# Patient Record
Sex: Female | Born: 1975
Health system: Southern US, Community
[De-identification: ages and names within clinical notes are randomized; demographics above are authoritative.]

## PROBLEM LIST (undated history)

## (undated) DIAGNOSIS — I341 Nonrheumatic mitral (valve) prolapse: Secondary | ICD-10-CM

## (undated) HISTORY — PX: WISDOM TOOTH EXTRACTION: SHX21

## (undated) HISTORY — DX: Nonrheumatic mitral (valve) prolapse: I34.1

---

## 2003-11-16 DIAGNOSIS — I341 Nonrheumatic mitral (valve) prolapse: Secondary | ICD-10-CM

## 2003-11-16 HISTORY — DX: Nonrheumatic mitral (valve) prolapse: I34.1

## 2010-11-26 ENCOUNTER — Inpatient Hospital Stay (HOSPITAL_COMMUNITY)
Admission: AD | Admit: 2010-11-26 | Discharge: 2010-11-28 | Payer: Self-pay | Source: Home / Self Care | Attending: Obstetrics and Gynecology | Admitting: Obstetrics and Gynecology

## 2010-11-30 LAB — CBC
HCT: 36 % (ref 36.0–46.0)
HCT: 30.9 % — ABNORMAL LOW (ref 36.0–46.0)
Hemoglobin: 11.9 g/dL — ABNORMAL LOW (ref 12.0–15.0)
Hemoglobin: 10.4 g/dL — ABNORMAL LOW (ref 12.0–15.0)
MCH: 28.6 pg (ref 26.0–34.0)
MCH: 29.3 pg (ref 26.0–34.0)
MCHC: 33.1 g/dL (ref 30.0–36.0)
MCHC: 33.7 g/dL (ref 30.0–36.0)
MCV: 86.5 fL (ref 78.0–100.0)
MCV: 87 fL (ref 78.0–100.0)
Platelets: 245 10*3/uL (ref 150–400)
Platelets: 212 10*3/uL (ref 150–400)
RBC: 4.16 MIL/uL (ref 3.87–5.11)
RBC: 3.55 MIL/uL — ABNORMAL LOW (ref 3.87–5.11)
RDW: 14 % (ref 11.5–15.5)
RDW: 14.3 % (ref 11.5–15.5)
WBC: 11.3 10*3/uL — ABNORMAL HIGH (ref 4.0–10.5)
WBC: 11.7 10*3/uL — ABNORMAL HIGH (ref 4.0–10.5)

## 2010-11-30 LAB — RPR: RPR Ser Ql: NONREACTIVE

## 2010-11-30 LAB — ABO/RH: ABO/RH(D): O POS

## 2012-04-21 ENCOUNTER — Ambulatory Visit (INDEPENDENT_AMBULATORY_CARE_PROVIDER_SITE_OTHER): Payer: 59 | Admitting: Family Medicine

## 2012-04-21 ENCOUNTER — Encounter: Payer: Self-pay | Admitting: *Deleted

## 2012-04-21 ENCOUNTER — Encounter: Payer: Self-pay | Admitting: Family Medicine

## 2012-04-21 VITALS — BP 110/77 | HR 76 | Temp 98.6°F | Ht 64.0 in | Wt 124.0 lb

## 2012-04-21 DIAGNOSIS — R29818 Other symptoms and signs involving the nervous system: Secondary | ICD-10-CM

## 2012-04-21 DIAGNOSIS — R2689 Other abnormalities of gait and mobility: Secondary | ICD-10-CM

## 2012-04-21 LAB — CBC WITH DIFFERENTIAL/PLATELET
Basophils Absolute: 0.1 10*3/uL (ref 0.0–0.1)
Basophils Relative: 1.2 % (ref 0.0–3.0)
Eosinophils Absolute: 0.1 10*3/uL (ref 0.0–0.7)
Eosinophils Relative: 1.9 % (ref 0.0–5.0)
HCT: 37.8 % (ref 36.0–46.0)
Hemoglobin: 12.8 g/dL (ref 12.0–15.0)
Lymphocytes Relative: 24.5 % (ref 12.0–46.0)
Lymphs Abs: 1.2 10*3/uL (ref 0.7–4.0)
MCHC: 33.8 g/dL (ref 30.0–36.0)
MCV: 90.4 fl (ref 78.0–100.0)
Monocytes Absolute: 0.4 10*3/uL (ref 0.1–1.0)
Monocytes Relative: 8.6 % (ref 3.0–12.0)
Neutro Abs: 3.2 10*3/uL (ref 1.4–7.7)
Neutrophils Relative %: 63.8 % (ref 43.0–77.0)
Platelets: 260 10*3/uL (ref 150.0–400.0)
RBC: 4.19 Mil/uL (ref 3.87–5.11)
RDW: 12.9 % (ref 11.5–14.6)
WBC: 4.9 10*3/uL (ref 4.5–10.5)

## 2012-04-21 LAB — BASIC METABOLIC PANEL
BUN: 11 mg/dL (ref 6–23)
CO2: 28 mEq/L (ref 19–32)
Calcium: 9.3 mg/dL (ref 8.4–10.5)
Chloride: 108 mEq/L (ref 96–112)
Creatinine, Ser: 0.7 mg/dL (ref 0.4–1.2)
GFR: 103.41 mL/min (ref >60.00)
Glucose, Bld: 91 mg/dL (ref 70–99)
Potassium: 3.7 mEq/L (ref 3.5–5.1)
Sodium: 140 mEq/L (ref 135–145)

## 2012-04-21 LAB — VITAMIN B12: Vitamin B-12: 308 pg/mL (ref 211–911)

## 2012-04-21 LAB — TSH: TSH: 0.73 u[IU]/mL (ref 0.35–5.50)

## 2012-04-21 LAB — FOLATE: Folate: 19.6 ng/mL (ref >5.9)

## 2012-04-21 MED ORDER — MECLIZINE HCL 25 MG PO TABS: 25.0000 mg | ORAL_TABLET | Freq: Three times a day (TID) | ORAL | Status: AC | PRN

## 2012-04-21 NOTE — Patient Instructions (Signed)
We'll notify you of your lab results Someone will call you with your neurology appt Take the Meclizine and see if symptoms improve- take when not working first Call with any questions or concerns Hang in there!!

## 2012-04-21 NOTE — Progress Notes (Signed)
  Subjective:    Patient ID: Julie Gibson, female    DOB: 01/25/1976, 36 y.o.   MRN: [id number]  HPI New to establish.  [facility]- [employer] [facility]  [facility]- [facility]  Dizziness- sxs started 3 weeks ago, 'like being on that carnival ride where the floor drops out from underneath you'.  'my balance is a little off'.  Becoming more frequent.  Has not fallen or passed out.  Occuring 6-7x/day.  Hx of sporadic eating but sxs are not related to eating.  Denies ear or sinus pain.  Not reproducible.  Doesn't occur w/ position changes.  No nausea.  'will have an odd feeling before'.  Occurs more frequently at work.  Denies any changes in last 3 weeks- meds, stress, sleep habits.  'i don't feel sick- just not right'.  Has difficulty walking down long hallway due to tunnel like effect.  No trouble w/ stairs.  No visual changes.  When she has the sense of falling it's 'always to the R'   Review of Systems For ROS see HPI     Objective:   Physical Exam  Vitals reviewed. Constitutional: She is oriented to person, place, and time. She appears well-developed and well-nourished. No distress.  HENT:  Head: Normocephalic and atraumatic.  Right Ear: Hearing, tympanic membrane and ear canal normal.  Left Ear: Hearing, tympanic membrane and ear canal normal.  Nose: Right sinus exhibits no maxillary sinus tenderness and no frontal sinus tenderness. Left sinus exhibits no maxillary sinus tenderness and no frontal sinus tenderness.  Mouth/Throat: Uvula is midline. No oropharyngeal exudate, posterior oropharyngeal edema or posterior oropharyngeal erythema.  Eyes: Conjunctivae and EOM are normal. Pupils are equal, round, and reactive to light.  Neck: Normal range of motion. Neck supple. No thyromegaly present.  Cardiovascular: Normal rate, regular rhythm, normal heart sounds and intact distal pulses.   No murmur heard. Pulmonary/Chest: Effort normal and breath sounds normal. No respiratory distress.  Abdominal: Soft. She  exhibits no distension. There is no tenderness.  Musculoskeletal: She exhibits no edema.  Lymphadenopathy:    She has no cervical adenopathy.  Neurological: She is alert and oriented to person, place, and time. She has normal reflexes. She displays no tremor. No cranial nerve deficit or sensory deficit. She exhibits normal muscle tone. Coordination (positive Romberg) abnormal. Gait normal.       2 beats of horizontal nystagmus to the L  Skin: Skin is warm and dry.  Psychiatric: She has a normal mood and affect. Her behavior is normal.          Assessment & Plan:

## 2012-04-21 NOTE — Assessment & Plan Note (Signed)
New.  Pt w/out typical sxs of vertigo.  No evidence of ear or sinus infxn.  Only abnormality on PE is nystagmus and + Romberg.  Check labs to r/o electrolyte abnormality, b12/folate deficiency, thyroid abnormality.  Discussed imaging w/ pt- she prefers to hold off until seeing Neuro.  Pt to call if sxs change or worsen.  Reviewed supportive care and red flags that should prompt return.  Pt expressed understanding and is in agreement w/ plan.

## 2017-04-02 DIAGNOSIS — S30860A Insect bite (nonvenomous) of lower back and pelvis, initial encounter: Secondary | ICD-10-CM | POA: Diagnosis not present

## 2020-08-15 ENCOUNTER — Other Ambulatory Visit: Payer: Self-pay

## 2020-08-15 ENCOUNTER — Ambulatory Visit (INDEPENDENT_AMBULATORY_CARE_PROVIDER_SITE_OTHER): Payer: 59 | Admitting: Physician Assistant

## 2020-08-15 ENCOUNTER — Telehealth: Payer: Self-pay | Admitting: Physician Assistant

## 2020-08-15 ENCOUNTER — Encounter: Payer: Self-pay | Admitting: Physician Assistant

## 2020-08-15 VITALS — BP 122/70 | HR 91 | Temp 97.7°F | Ht 63.0 in | Wt 140.4 lb

## 2020-08-15 DIAGNOSIS — G5621 Lesion of ulnar nerve, right upper limb: Secondary | ICD-10-CM | POA: Diagnosis not present

## 2020-08-15 MED ORDER — MELOXICAM 15 MG PO TABS: 15.0000 mg | ORAL_TABLET | Freq: Every day | ORAL | 0 refills | Status: DC

## 2020-08-15 NOTE — Progress Notes (Signed)
Patient presents to clinic today to establish care.  Patient endorses ongoing issue of pain, numbness/tingling of her right arm, now with decreased grip strength.  Notes symptoms started out mainly involving her fourth and fifth fingers but is now spread to include the bulk of her hand.  Denies any wrist pain or swelling.  Does note medial elbow pain, worse with certain activities.  Denies any prior trauma or injury.  Has been trying to keep her arm outstretched even while sleeping but denies any significant improvement in her symptoms with this.  Denies symptoms of her left upper extremity.  Health Maintenance: Immunizations -- Gets flu shot at work.  PAP -- UTD per patient.   Past Medical History:  Diagnosis Date  . MVP (mitral valve prolapse) 2005   diagnosed after bout of tachycardia. Has not had issue in over 10 years    Past Surgical History:  Procedure Laterality Date  . WISDOM TOOTH EXTRACTION      Current Outpatient Medications on File Prior to Visit  Medication Sig Dispense Refill  . ibuprofen (ADVIL) 400 MG tablet Take 400 mg by mouth as needed.    Marland Kitchen levonorgestrel (MIRENA) 20 MCG/24HR IUD 1 each by Intrauterine route once.     No current facility-administered medications on file prior to visit.    Allergies  Allergen Reactions  . Percocet [Oxycodone-Acetaminophen]     Nausea, vomitting    Family History  Problem Relation Age of Onset  . Hypertension Mother   . Hypertension Father     Social History   Socioeconomic History  . Marital status: Married    Spouse name: Not on file  . Number of children: Not on file  . Years of education: Not on file  . Highest education level: Not on file  Occupational History  . Not on file  Tobacco Use  . Smoking status: Never Smoker  . Smokeless tobacco: Never Used  Vaping Use  . Vaping Use: Never used  Substance and Sexual Activity  . Alcohol use: Yes    Alcohol/week: 1.0 standard drink    Types: 1 Glasses of  wine per week    Comment: Every month   . Drug use: No  . Sexual activity: Yes    Birth control/protection: I.U.D.    Comment: Mirena  Other Topics Concern  . Not on file  Social History Narrative  . Not on file   Social Determinants of Health   Financial Resource Strain:   . Difficulty of Paying Living Expenses: Not on file  Food Insecurity:   . Worried About Programme researcher, broadcasting/film/video in the Last Year: Not on file  . Ran Out of Food in the Last Year: Not on file  Transportation Needs:   . Lack of Transportation (Medical): Not on file  . Lack of Transportation (Non-Medical): Not on file  Physical Activity:   . Days of Exercise per Week: Not on file  . Minutes of Exercise per Session: Not on file  Stress:   . Feeling of Stress : Not on file  Social Connections:   . Frequency of Communication with Friends and Family: Not on file  . Frequency of Social Gatherings with Friends and Family: Not on file  . Attends Religious Services: Not on file  . Active Member of Clubs or Organizations: Not on file  . Attends Banker Meetings: Not on file  . Marital Status: Not on file  Intimate Partner Violence:   . Fear of  Current or Ex-Partner: Not on file  . Emotionally Abused: Not on file  . Physically Abused: Not on file  . Sexually Abused: Not on file   ROS Pertinent ROS are listed in the HPI.  BP 122/70   Pulse 91   Temp 97.7 F (36.5 C) (Temporal)   Ht 5\' 3"  (1.6 m)   Wt 140 lb 6.4 oz (63.7 kg)   SpO2 99%   BMI 24.87 kg/m   Physical Exam Vitals reviewed.  Constitutional:      Appearance: Normal appearance.  HENT:     Head: Normocephalic and atraumatic.  Cardiovascular:     Rate and Rhythm: Normal rate and regular rhythm.     Pulses: Normal pulses.     Heart sounds: Normal heart sounds.  Pulmonary:     Effort: Pulmonary effort is normal.  Musculoskeletal:     Right elbow: Normal.     Right forearm: Normal.     Right wrist: No swelling, deformity, effusion,  tenderness, bony tenderness or crepitus. Normal range of motion.     Right hand: No swelling or deformity. Normal range of motion. Decreased strength of thumb/finger opposition. Normal strength of finger abduction and wrist extension. Decreased sensation of the ulnar distribution and median distribution. There is no disruption of two-point discrimination. Normal capillary refill. Normal pulse.     Cervical back: Neck supple.  Neurological:     General: No focal deficit present.     Mental Status: She is alert.     Assessment/Plan: 1. Cubital tunnel syndrome on right Concern for substantial cubital tunnel syndrome and potentially other areas of nerve impingement/compression given the severity and progression of her symptoms.  Decreased grip strength noted.  Decreased sensation in third fourth and fifth fingers of the hand.  We will have her continue supportive measures and bracing.  Urgent referral placed to orthopedic surgery for further evaluation and management. - Ambulatory referral to Orthopedic Surgery  This visit occurred during the SARS-CoV-2 public health emergency.  Safety protocols were in place, including screening questions prior to the visit, additional usage of staff PPE, and extensive cleaning of exam room while observing appropriate contact time as indicated for disinfecting solutions.     , PA-C

## 2020-08-15 NOTE — Telephone Encounter (Signed)
Medication was resent

## 2020-08-15 NOTE — Patient Instructions (Signed)
Please continue working on keeping the arm fully extended and elevated when possible.  Take the Meloxicam once daily with food. Tylenol if needed for breakthrough pain.   I am getting you set up with a specialist. You should receive a phone call within a week to schedule an appointment.   If not, please call me.    Cubital Tunnel Syndrome  Cubital tunnel syndrome is a condition that causes pain and weakness of the forearm and hand. It happens when one of the nerves that runs along the inside of the elbow joint (ulnar nerve) becomes irritated. This condition is usually caused by repeated arm motions that are done during sports or work-related activities. What are the causes? This condition may be caused by:  Increased pressure on the ulnar nerve at the elbow, arm, or forearm. This can result from: ? Irritation caused by repeated elbow bending. ? Poorly healed elbow fractures. ? Tumors in the elbow. These are usually noncancerous (benign). ? Scar tissue that develops in the elbow after an injury. ? Bony growths (spurs) near the ulnar nerve.  Stretching of the nerve due to loose elbow ligaments.  Trauma to the nerve at the elbow. What increases the risk? The following factors may make you more likely to develop this condition:  Doing manual labor that requires frequent bending of the elbow.  Playing sports that include repeated or strenuous throwing motions, such as baseball.  Playing contact sports, such as football or lacrosse.  Not warming up properly before activities.  Having diabetes.  Having an underactive thyroid (hypothyroidism). What are the signs or symptoms? Symptoms of this condition include:  Clumsiness or weakness of the hand.  Tenderness of the inner elbow.  Aching or soreness of the inner elbow, forearm, or fingers, especially the little finger or the ring finger.  Increased pain when forcing the elbow to bend.  Reduced control when throwing  objects.  Tingling, numbness, or a burning feeling inside the forearm or in part of the hand or fingers, especially the little finger or the ring finger.  Sharp pains that shoot from the elbow down to the wrist and hand.  The inability to grip or pinch hard. How is this diagnosed? This condition is diagnosed based on:  Your symptoms and medical history. Your health care provider will also ask for details about any injury.  A physical exam. You may also have tests, including:  Electromyogram (EMG). This test measures electrical signals sent by your nerves into the muscles.  Nerve conduction study. This test measures how well electrical signals pass through your nerves.  Imaging tests, such as X-rays, ultrasound, and MRI. These tests check for possible causes of your condition. How is this treated? This condition may be treated by:  Stopping the activities that are causing your symptoms to get worse.  Icing and taking medicines to reduce pain and swelling.  Wearing a splint to prevent your elbow from bending, or wearing an elbow pad where the ulnar nerve is closest to the skin.  Working with a physical therapist in less severe cases. This may help to: ? Decrease your symptoms. ? Improve the strength and range of motion of your elbow, forearm, and hand. If these treatments do not help, surgery may be needed. Follow these instructions at home: If you have a splint:  Wear the splint as told by your health care provider. Remove it only as told by your health care provider.  Loosen the splint if your fingers tingle, become  numb, or turn cold and blue.  Keep the splint clean.  If the splint is not waterproof: ? Do not let it get wet. ? Cover it with a watertight covering when you take a bath or shower. Managing pain, stiffness, and swelling   If directed, put ice on the injured area: ? Put ice in a plastic bag. ? Place a towel between your skin and the bag. ? Leave the ice  on for 20 minutes, 2-3 times a day.  Move your fingers often to avoid stiffness and to lessen swelling.  Raise (elevate) the injured area above the level of your heart while you are sitting or lying down. General instructions  Take over-the-counter and prescription medicines only as told by your health care provider.  Do any exercise or physical therapy as told by your health care provider.  Do not drive or use heavy machinery while taking prescription pain medicine.  If you were given an elbow pad, wear it as told by your health care provider.  Keep all follow-up visits as told by your health care provider. This is important. Contact a health care provider if:  Your symptoms get worse.  Your symptoms do not get better with treatment.  You have new pain.  Your hand on the injured side feels numb or cold. Summary  Cubital tunnel syndrome is a condition that causes pain and weakness of the forearm and hand.  You are more likely to develop this condition if you do work or play sports that involve repeated arm movements.  This condition is often treated by stopping repetitive activities, applying ice, and using anti-inflammatory medicines.  In rare cases, surgery may be needed. This information is not intended to replace advice given to you by your health care provider. Make sure you discuss any questions you have with your health care provider. Document Revised: 03/20/2018 Document Reviewed: 03/20/2018 Elsevier Patient Education  2020 [employer].

## 2020-08-15 NOTE — Telephone Encounter (Signed)
Pt made aware

## 2020-08-15 NOTE — Telephone Encounter (Signed)
Pt called in stating that her current pharmacy is [facility] [address] [place]   Please re-send scripts to that pharmacy.

## 2020-08-28 ENCOUNTER — Ambulatory Visit: Payer: 59 | Admitting: Orthopaedic Surgery

## 2020-08-28 ENCOUNTER — Encounter: Payer: Self-pay | Admitting: Orthopaedic Surgery

## 2020-08-28 DIAGNOSIS — R202 Paresthesia of skin: Secondary | ICD-10-CM | POA: Diagnosis not present

## 2020-08-28 DIAGNOSIS — R200 Anesthesia of skin: Secondary | ICD-10-CM | POA: Insufficient documentation

## 2020-08-28 NOTE — Progress Notes (Signed)
Office Visit Note   Patient: Julie Gibson           Date of Birth: Aug 09, 1976           MRN: [id number] Visit Date: 08/28/2020              Requested by: Waldon Merl, PA-C [id number] [place],  [place] [address] PCP: Waldon Merl, PA-C   Assessment & Plan: Visit Diagnoses:  1. Numbness and tingling in right hand     Plan: Impression is right cubital tunnel syndrome.  We will obtain nerve conduction studies at this time to evaluate for this.  Follow-up afterwards.  Follow-Up Instructions: Return if symptoms worsen or fail to improve.   Orders:  No orders of the defined types were placed in this encounter.  No orders of the defined types were placed in this encounter.     Procedures: No procedures performed   Clinical Data: No additional findings.   Subjective: Chief Complaint  Patient presents with  . 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 [employer] comes in for evaluation of possible cubital tunnel syndrome.  Referral from PCP.  She has pain in her elbow with flexion and numbness in her ring and small finger with recent worsening over the last 2 weeks.  She has been taking meloxicam which has helped.  She feels like overall her condition is getting worse.  Extending her elbow helps with the symptoms.  Denies any injuries.   Review of Systems  Constitutional: Negative.   HENT: Negative.   Eyes: Negative.   Respiratory: Negative.   Cardiovascular: Negative.   Endocrine: Negative.   Musculoskeletal: Negative.   Neurological: Negative.   Hematological: Negative.   Psychiatric/Behavioral: Negative.   All other systems reviewed and are negative.    Objective: Vital Signs: There were no vitals taken for this visit.  Physical Exam Vitals and nursing note reviewed.  Constitutional:      Appearance: She is well-developed.  HENT:     Head: Normocephalic  and atraumatic.  Pulmonary:     Effort: Pulmonary effort is normal.  Abdominal:     Palpations: Abdomen is soft.  Musculoskeletal:     Cervical back: Neck supple.  Skin:    General: Skin is warm.     Capillary Refill: Capillary refill takes less than 2 seconds.  Neurological:     Mental Status: She is alert and oriented to person, place, and time.  Psychiatric:        Behavior: Behavior normal.        Thought Content: Thought content normal.        Judgment: Judgment normal.     Ortho Exam Right elbow shows stable ulnar nerve.  Positive Tinel's.  Medial epicondyle nontender.  Good strength of the ulnar nerve distribution.  Slight decreased sensation in the small finger. Specialty Comments:  No specialty comments available.  Imaging: No results found.   PMFS History: Patient Active Problem List   Diagnosis Date Noted  . Numbness and tingling in right hand 08/28/2020  . Balance disorder 04/21/2012   Past Medical History:  Diagnosis Date  . MVP (mitral valve prolapse) 2005   diagnosed after bout of tachycardia. Has not had issue in over 10 years    Family History  Problem Relation Age of Onset  . Hypertension Mother   . Hypertension  Father     Past Surgical History:  Procedure Laterality Date  . WISDOM TOOTH EXTRACTION     Social History   Occupational History  . Not on file  Tobacco Use  . Smoking status: Never Smoker  . Smokeless tobacco: Never Used  Vaping Use  . Vaping Use: Never used  Substance and Sexual Activity  . Alcohol use: Yes    Alcohol/week: 1.0 standard drink    Types: 1 Glasses of wine per week    Comment: Every month   . Drug use: No  . Sexual activity: Yes    Birth control/protection: I.U.D.    Comment: Mirena

## 2020-08-29 ENCOUNTER — Other Ambulatory Visit: Payer: Self-pay

## 2020-08-29 DIAGNOSIS — R200 Anesthesia of skin: Secondary | ICD-10-CM

## 2020-08-29 DIAGNOSIS — R202 Paresthesia of skin: Secondary | ICD-10-CM

## 2020-09-30 ENCOUNTER — Other Ambulatory Visit: Payer: Self-pay

## 2020-09-30 ENCOUNTER — Encounter: Payer: Self-pay | Admitting: Physical Medicine and Rehabilitation

## 2020-09-30 ENCOUNTER — Ambulatory Visit: Payer: 59 | Admitting: Physical Medicine and Rehabilitation

## 2020-09-30 DIAGNOSIS — R202 Paresthesia of skin: Secondary | ICD-10-CM | POA: Diagnosis not present

## 2020-09-30 NOTE — Progress Notes (Signed)
Finger numbness right worse than left. Numbness worse in third, fourth, and fifth fingers on right. This has improved. Constant right elbow pain. Right hand dominant No lotion per patient Numeric Pain Rating Scale and Functional Assessment Average Pain 5   In the last MONTH (on 0-10 scale) has pain interfered with the following?  1. General activity like being  able to carry out your everyday physical activities such as walking, climbing stairs, carrying groceries, or moving a chair?  Rating(8)

## 2020-10-03 ENCOUNTER — Other Ambulatory Visit: Payer: Self-pay

## 2020-10-03 ENCOUNTER — Ambulatory Visit: Payer: Self-pay

## 2020-10-03 ENCOUNTER — Ambulatory Visit: Payer: 59 | Admitting: Orthopaedic Surgery

## 2020-10-03 ENCOUNTER — Encounter: Payer: Self-pay | Admitting: Orthopaedic Surgery

## 2020-10-03 DIAGNOSIS — M25541 Pain in joints of right hand: Secondary | ICD-10-CM

## 2020-10-03 DIAGNOSIS — R202 Paresthesia of skin: Secondary | ICD-10-CM | POA: Diagnosis not present

## 2020-10-03 DIAGNOSIS — R200 Anesthesia of skin: Secondary | ICD-10-CM | POA: Diagnosis not present

## 2020-10-03 NOTE — Progress Notes (Signed)
Julie Gibson - 44 y.o. female MRN [id number]  Date of birth: [birth year]  Office Visit Note: Visit Date: 09/30/2020 PCP: Waldon Merl, PA-C Referred by: Waldon Merl, PA-C  Subjective: Chief Complaint  Patient presents with  . Right Hand - Numbness  . Right Elbow - Pain   HPI:  Julie Gibson is a 44 y.o. female who comes in today at the request of Dr. Glee Arvin for electrodiagnostic study of the Right upper extremities.  Patient is Right hand dominant.  She works as a Sales executive.  She reports right sided numbness and tingling particularly in the third fourth and fifth fingers.  Mild symptoms on the left.  She reports constant elbow pain.  Her elbow pain is medial.  No radicular complaints.  No history of cervical spine issues.  Her biggest complaint is her right medial elbow pain.   ROS Otherwise per HPI.  Assessment & Plan: Visit Diagnoses:  1. Paresthesia of skin     Plan: Impression: Essentially NORMAL electrodiagnostic study of the right upper limb.  There is no significant electrodiagnostic evidence of nerve entrapment, brachial plexopathy or cervical radiculopathy.    As you know, purely sensory or demyelinating radiculopathies and chemical radiculitis may not be detected with this particular electrodiagnostic study.  Recommendations: 1.  Follow-up with referring physician. 2.  Continue current management of symptoms.  Meds & Orders: No orders of the defined types were placed in this encounter.   Orders Placed This Encounter  Procedures  . NCV with EMG (electromyography)    Follow-up: Glee Arvin, MD  Procedures: No procedures performed  EMG & NCV Findings: All nerve conduction studies (as indicated in the following tables) were within normal limits.    All examined muscles (as indicated in the following table) showed no evidence of electrical instability.    Impression: Essentially NORMAL electrodiagnostic study of the right upper limb.  There  is no significant electrodiagnostic evidence of nerve entrapment, brachial plexopathy or cervical radiculopathy.    As you know, purely sensory or demyelinating radiculopathies and chemical radiculitis may not be detected with this particular electrodiagnostic study.  Recommendations: 1.  Follow-up with referring physician. 2.  Continue current management of symptoms.  ___________________________ Naaman Plummer FAAPMR Board Certified, American Board of Physical Medicine and Rehabilitation    Nerve Conduction Studies Anti Sensory Summary Table   Stim Site NR Peak (ms) Norm Peak (ms) P-T Amp (V) Norm P-T Amp Site1 Site2 Delta-P (ms) Dist (cm) Vel (m/s) Norm Vel (m/s)  Right Median Acr Palm Anti Sensory (2nd Digit)  31C  Wrist    3.3 <3.6 52.3 >10 Wrist Palm 1.4 0.0    Palm    1.9 <2.0 59.3         Right Radial Anti Sensory (Base 1st Digit)  31.1C  Wrist    2.1 <3.1 38.9  Wrist Base 1st Digit 2.1 0.0    Right Ulnar Anti Sensory (5th Digit)  31.4C  Wrist    3.2 <3.7 42.7 >15.0 Wrist 5th Digit 3.2 14.0 44 >38   Motor Summary Table   Stim Site NR Onset (ms) Norm Onset (ms) O-P Amp (mV) Norm O-P Amp Site1 Site2 Delta-0 (ms) Dist (cm) Vel (m/s) Norm Vel (m/s)  Right Median Motor (Abd Poll Brev)  31.4C  Wrist    3.2 <4.2 7.4 >5 Elbow Wrist 3.6 20.0 56 >50  Elbow    6.8  7.2         Right Ulnar Motor (  Abd Dig Min)  31.7C  Wrist    2.7 <4.2 9.6 >3 B Elbow Wrist 2.9 19.5 67 >53  B Elbow    5.6  9.5  A Elbow B Elbow 1.3 9.5 73 >53  A Elbow    6.9  9.7          EMG   Side Muscle Nerve Root Ins Act Fibs Psw Amp Dur Poly Recrt Int Dennie Bible Comment  Right Abd Poll Brev Median C8-T1 Nml Nml Nml Nml Nml 0 Nml Nml   Right 1stDorInt Ulnar C8-T1 Nml Nml Nml Nml Nml 0 Nml Nml   Right PronatorTeres Median C6-7 Nml Nml Nml Nml Nml 0 Nml Nml   Right Biceps Musculocut C5-6 Nml Nml Nml Nml Nml 0 Nml Nml   Right Deltoid Axillary C5-6 Nml Nml Nml Nml Nml 0 Nml Nml     Nerve Conduction Studies Anti  Sensory Left/Right Comparison   Stim Site L Lat (ms) R Lat (ms) L-R Lat (ms) L Amp (V) R Amp (V) L-R Amp (%) Site1 Site2 L Vel (m/s) R Vel (m/s) L-R Vel (m/s)  Median Acr Palm Anti Sensory (2nd Digit)  31C  Wrist  3.3   52.3  Wrist Palm     Palm  1.9   59.3        Radial Anti Sensory (Base 1st Digit)  31.1C  Wrist  2.1   38.9  Wrist Base 1st Digit     Ulnar Anti Sensory (5th Digit)  31.4C  Wrist  3.2   42.7  Wrist 5th Digit  44    Motor Left/Right Comparison   Stim Site L Lat (ms) R Lat (ms) L-R Lat (ms) L Amp (mV) R Amp (mV) L-R Amp (%) Site1 Site2 L Vel (m/s) R Vel (m/s) L-R Vel (m/s)  Median Motor (Abd Poll Brev)  31.4C  Wrist  3.2   7.4  Elbow Wrist  56   Elbow  6.8   7.2        Ulnar Motor (Abd Dig Min)  31.7C  Wrist  2.7   9.6  B Elbow Wrist  67   B Elbow  5.6   9.5  A Elbow B Elbow  73   A Elbow  6.9   9.7           Waveforms:             Clinical History: No specialty comments available.     Objective:  VS:  HT:    WT:   BMI:     BP:   HR: bpm  TEMP: ( )  RESP:  Physical Exam Musculoskeletal:        General: No swelling, tenderness or deformity.     Comments: Inspection reveals no atrophy of the bilateral APB or FDI or hand intrinsics. There is no swelling, color changes, allodynia or dystrophic changes. There is 5 out of 5 strength in the bilateral wrist extension, finger abduction and long finger flexion. There is intact sensation to light touch in all dermatomal and peripheral nerve distributions.  There is a negative Hoffmann's test bilaterally.  Skin:    General: Skin is warm and dry.     Findings: No erythema or rash.  Neurological:     General: No focal deficit present.     Mental Status: She is alert and oriented to person, place, and time.     Motor: No weakness or abnormal muscle tone.     Coordination: Coordination  normal.  Psychiatric:        Mood and Affect: Mood normal.        Behavior: Behavior normal.      Imaging: No  results found.

## 2020-10-03 NOTE — Progress Notes (Signed)
Office Visit Note   Patient: Julie Gibson           Date of Birth: [birth year]           MRN: [id number] Visit Date: 10/03/2020              Requested by: Waldon Merl, PA-C [address] [place],  [place] [address] PCP: Waldon Merl, PA-C   Assessment & Plan: Visit Diagnoses:  1. Numbness and tingling in right hand     Plan: Impression is right cubital tunnel syndrome with negative nerve conduction studies..  We will refer the patient to Dr. Prince Rome for ultrasound-guided cortisone injection.  We also talked about the possibility of trying gabapentin or Elavil but the patient politely declined.  Follow-Up Instructions: Return in about 4 weeks (around 10/31/2020).   Orders:  Orders Placed This Encounter  Procedures  . XR Elbow 2 Views Right   No orders of the defined types were placed in this encounter.     Procedures: No procedures performed   Clinical Data: No additional findings.   Subjective: Chief Complaint  Patient presents with  . Left Hand - Pain  . 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  She was recently seen in our office with deep aches as well as paresthesias to the right elbow and forearm along the ulnar nerve distribution.  Nerve conduction study ordered to the right upper extremity was negative for cubital tunnel syndrome.  Majority of patient's symptoms occur when her elbow is flexed at 90 degrees and when she tries to grab or grasp something by supinating the elbow.  She notes is doing up the forearm and into the elbow with these motions.  Review of Systems as detailed in HPI.  All others reviewed and are negative.   Objective: Vital Signs: There were no vitals taken for this visit.  Physical Exam well-developed well-nourished female no acute distress.  Alert  and oriented x3.  Ortho Exam Right elbow shows no subluxating ulnar nerve.  She is positive Tinel at the cubital tunnel as well as just proximal to the lateral epicondyle.  Full range of motion of the elbow.  No muscle atrophy of the hand.  Specialty Comments:  No specialty comments available.  Imaging: No results found.   PMFS History: Patient Active Problem List   Diagnosis Date Noted  . Numbness and tingling in right hand 08/28/2020  . Balance disorder 04/21/2012   Past Medical History:  Diagnosis Date  . MVP (mitral valve prolapse) 2005   diagnosed after bout of tachycardia. Has not had issue in over 10 years    Family History  Problem Relation Age of Onset  . Hypertension Mother   . Hypertension Father     Past Surgical History:  Procedure Laterality Date  . WISDOM TOOTH EXTRACTION     Social History   Occupational History  . Not on file  Tobacco Use  . Smoking status: Never Smoker  . Smokeless tobacco: Never Used  Vaping Use  . Vaping Use: Never used  Substance and Sexual Activity  . Alcohol use: Yes    Alcohol/week: 1.0 standard drink    Types: 1 Glasses of wine per week    Comment: Every month   . Drug use: No  .  Sexual activity: Yes    Birth control/protection: I.U.D.    Comment: Mirena

## 2020-10-03 NOTE — Procedures (Signed)
EMG & NCV Findings: All nerve conduction studies (as indicated in the following tables) were within normal limits.    All examined muscles (as indicated in the following table) showed no evidence of electrical instability.    Impression: Essentially NORMAL electrodiagnostic study of the right upper limb.  There is no significant electrodiagnostic evidence of nerve entrapment, brachial plexopathy or cervical radiculopathy.    As you know, purely sensory or demyelinating radiculopathies and chemical radiculitis may not be detected with this particular electrodiagnostic study.  Recommendations: 1.  Follow-up with referring physician. 2.  Continue current management of symptoms.  ___________________________ Naaman Plummer FAAPMR Board Certified, American Board of Physical Medicine and Rehabilitation    Nerve Conduction Studies Anti Sensory Summary Table   Stim Site NR Peak (ms) Norm Peak (ms) P-T Amp (V) Norm P-T Amp Site1 Site2 Delta-P (ms) Dist (cm) Vel (m/s) Norm Vel (m/s)  Right Median Acr Palm Anti Sensory (2nd Digit)  31C  Wrist    3.3 <3.6 52.3 >10 Wrist Palm 1.4 0.0    Palm    1.9 <2.0 59.3         Right Radial Anti Sensory (Base 1st Digit)  31.1C  Wrist    2.1 <3.1 38.9  Wrist Base 1st Digit 2.1 0.0    Right Ulnar Anti Sensory (5th Digit)  31.4C  Wrist    3.2 <3.7 42.7 >15.0 Wrist 5th Digit 3.2 14.0 44 >38   Motor Summary Table   Stim Site NR Onset (ms) Norm Onset (ms) O-P Amp (mV) Norm O-P Amp Site1 Site2 Delta-0 (ms) Dist (cm) Vel (m/s) Norm Vel (m/s)  Right Median Motor (Abd Poll Brev)  31.4C  Wrist    3.2 <4.2 7.4 >5 Elbow Wrist 3.6 20.0 56 >50  Elbow    6.8  7.2         Right Ulnar Motor (Abd Dig Min)  31.7C  Wrist    2.7 <4.2 9.6 >3 B Elbow Wrist 2.9 19.5 67 >53  B Elbow    5.6  9.5  A Elbow B Elbow 1.3 9.5 73 >53  A Elbow    6.9  9.7          EMG   Side Muscle Nerve Root Ins Act Fibs Psw Amp Dur Poly Recrt Int Dennie Bible Comment  Right Abd Poll Brev Median C8-T1  Nml Nml Nml Nml Nml 0 Nml Nml   Right 1stDorInt Ulnar C8-T1 Nml Nml Nml Nml Nml 0 Nml Nml   Right PronatorTeres Median C6-7 Nml Nml Nml Nml Nml 0 Nml Nml   Right Biceps Musculocut C5-6 Nml Nml Nml Nml Nml 0 Nml Nml   Right Deltoid Axillary C5-6 Nml Nml Nml Nml Nml 0 Nml Nml     Nerve Conduction Studies Anti Sensory Left/Right Comparison   Stim Site L Lat (ms) R Lat (ms) L-R Lat (ms) L Amp (V) R Amp (V) L-R Amp (%) Site1 Site2 L Vel (m/s) R Vel (m/s) L-R Vel (m/s)  Median Acr Palm Anti Sensory (2nd Digit)  31C  Wrist  3.3   52.3  Wrist Palm     Palm  1.9   59.3        Radial Anti Sensory (Base 1st Digit)  31.1C  Wrist  2.1   38.9  Wrist Base 1st Digit     Ulnar Anti Sensory (5th Digit)  31.4C  Wrist  3.2   42.7  Wrist 5th Digit  44    Motor Left/Right Comparison  Stim Site L Lat (ms) R Lat (ms) L-R Lat (ms) L Amp (mV) R Amp (mV) L-R Amp (%) Site1 Site2 L Vel (m/s) R Vel (m/s) L-R Vel (m/s)  Median Motor (Abd Poll Brev)  31.4C  Wrist  3.2   7.4  Elbow Wrist  56   Elbow  6.8   7.2        Ulnar Motor (Abd Dig Min)  31.7C  Wrist  2.7   9.6  B Elbow Wrist  67   B Elbow  5.6   9.5  A Elbow B Elbow  73   A Elbow  6.9   9.7           Waveforms:

## 2020-10-29 ENCOUNTER — Encounter: Payer: Self-pay | Admitting: Orthopaedic Surgery

## 2020-10-29 ENCOUNTER — Ambulatory Visit: Payer: 59 | Admitting: Orthopaedic Surgery

## 2020-10-29 DIAGNOSIS — M25521 Pain in right elbow: Secondary | ICD-10-CM | POA: Diagnosis not present

## 2020-10-29 DIAGNOSIS — R202 Paresthesia of skin: Secondary | ICD-10-CM

## 2020-10-29 DIAGNOSIS — R200 Anesthesia of skin: Secondary | ICD-10-CM

## 2020-10-29 NOTE — Progress Notes (Signed)
   Office Visit Note   Patient: Julie Gibson           Date of Birth: [birth year]           MRN: [id number] Visit Date: 10/29/2020              Requested by: Waldon Merl, PA-C [address] [place],  [place] [address] PCP: Waldon Merl, PA-C   Assessment & Plan: Visit Diagnoses:  1. Pain in right elbow   2. Numbness and tingling in right hand     Plan: Impression is resolved right elbow ulnar neuritis status post injection and mild extensor tendinitis from overuse.  Stretching exercises provided for the new elbow symptoms.  She will try a counterforce brace as well.  R ICE as needed.  Follow-up as needed.  Follow-Up Instructions: Return if symptoms worsen or fail to improve.   Orders:  No orders of the defined types were placed in this encounter.  No orders of the defined types were placed in this encounter.     Procedures: No procedures performed   Clinical Data: No additional findings.   Subjective: Chief Complaint  Patient presents with  . Right Elbow - Follow-up    Julie Gibson returns today for follow-up of her clinical right cubital tunnel syndrome.  Since the injection she has had felt a tremendous amount of relief.  She is no longer awakened by the symptoms.  She did notice some elbow pain on the lateral side after she went Christmas shopping.  She has some mild discomfort in the common extensor muscles.  Denies any radiation of pain.   Review of Systems   Objective: Vital Signs: There were no vitals taken for this visit.  Physical Exam  Ortho Exam Right elbow shows mild tenderness of the lateral epicondyle and the common extensor muscle belly.  No pain with resisted wrist extension or long finger extension.  Negative Tinel's at the radial tunnel. Specialty Comments:  No specialty comments available.  Imaging: No results found.   PMFS History: Patient Active Problem List   Diagnosis Date Noted  . Numbness and tingling in right hand 08/28/2020   . Balance disorder 04/21/2012   Past Medical History:  Diagnosis Date  . MVP (mitral valve prolapse) 2005   diagnosed after bout of tachycardia. Has not had issue in over 10 years    Family History  Problem Relation Age of Onset  . Hypertension Mother   . Hypertension Father     Past Surgical History:  Procedure Laterality Date  . WISDOM TOOTH EXTRACTION     Social History   Occupational History  . Not on file  Tobacco Use  . Smoking status: Never Smoker  . Smokeless tobacco: Never Used  Vaping Use  . Vaping Use: Never used  Substance and Sexual Activity  . Alcohol use: Yes    Alcohol/week: 1.0 standard drink    Types: 1 Glasses of wine per week    Comment: Every month   . Drug use: No  . Sexual activity: Yes    Birth control/protection: I.U.D.    Comment: Mirena

## 2021-06-22 ENCOUNTER — Encounter (HOSPITAL_BASED_OUTPATIENT_CLINIC_OR_DEPARTMENT_OTHER): Payer: Self-pay | Admitting: Obstetrics and Gynecology

## 2021-06-22 ENCOUNTER — Other Ambulatory Visit: Payer: Self-pay

## 2021-06-22 DIAGNOSIS — D069 Carcinoma in situ of cervix, unspecified: Secondary | ICD-10-CM

## 2021-06-22 DIAGNOSIS — Z973 Presence of spectacles and contact lenses: Secondary | ICD-10-CM

## 2021-06-22 HISTORY — DX: Carcinoma in situ of cervix, unspecified: D06.9

## 2021-06-22 HISTORY — DX: Presence of spectacles and contact lenses: Z97.3

## 2021-06-22 NOTE — Progress Notes (Addendum)
Spoke w/ via phone for pre-op interview---pt Lab needs dos----  urine poct per anesthesia, surgery orders pending             Lab results------none COVID test -----patient states asymptomatic no test needed Arrive at -------1030 am 06-24-2021 NPO after MN NO Solid Food.  Clear liquids from MN until---930 am then npo Med rec completed Medications to take morning of surgery -----none Diabetic medication -----n/a Patient instructed no nail polish to be worn day of surgery Patient instructed to bring photo id and insurance card day of surgery Patient aware to have Driver (ride ) / caregiver    spouse Julie Gibson or daughter for 24 hours after surgery  Patient Special Instructions -----none Pre-Op special Istructions -----surgery orders req dr Tenny Craw epic ib Patient verbalized understanding of instructions that were given at this phone interview. Patient denies shortness of breath, chest pain, fever, cough at this phone interview.

## 2021-06-23 ENCOUNTER — Other Ambulatory Visit: Payer: Self-pay | Admitting: Obstetrics and Gynecology

## 2021-06-24 ENCOUNTER — Encounter (HOSPITAL_BASED_OUTPATIENT_CLINIC_OR_DEPARTMENT_OTHER): Payer: Self-pay | Admitting: Obstetrics and Gynecology

## 2021-06-24 ENCOUNTER — Encounter (HOSPITAL_BASED_OUTPATIENT_CLINIC_OR_DEPARTMENT_OTHER)
Admission: RE | Disposition: A | Discharge status: Home / Self Care | Payer: Self-pay | Source: Home / Self Care | Attending: Obstetrics and Gynecology

## 2021-06-24 ENCOUNTER — Ambulatory Visit (HOSPITAL_BASED_OUTPATIENT_CLINIC_OR_DEPARTMENT_OTHER): Payer: 59 | Admitting: Anesthesiology

## 2021-06-24 ENCOUNTER — Ambulatory Visit (HOSPITAL_BASED_OUTPATIENT_CLINIC_OR_DEPARTMENT_OTHER)
Admission: RE | Admit: 2021-06-24 | Discharge: 2021-06-24 | Disposition: A | Discharge status: Home / Self Care | Payer: 59 | Attending: Obstetrics and Gynecology | Admitting: Obstetrics and Gynecology

## 2021-06-24 ENCOUNTER — Other Ambulatory Visit: Payer: Self-pay

## 2021-06-24 DIAGNOSIS — Z975 Presence of (intrauterine) contraceptive device: Secondary | ICD-10-CM | POA: Diagnosis not present

## 2021-06-24 DIAGNOSIS — Z885 Allergy status to narcotic agent status: Secondary | ICD-10-CM | POA: Diagnosis not present

## 2021-06-24 DIAGNOSIS — D069 Carcinoma in situ of cervix, unspecified: Secondary | ICD-10-CM | POA: Diagnosis present

## 2021-06-24 HISTORY — PX: CERVICAL CONIZATION W/BX: SHX1330

## 2021-06-24 LAB — CBC
HCT: 36.2 % (ref 36.0–46.0)
Hemoglobin: 11.9 g/dL — ABNORMAL LOW (ref 12.0–15.0)
MCH: 30.5 pg (ref 26.0–34.0)
MCHC: 32.9 g/dL (ref 30.0–36.0)
MCV: 92.8 fL (ref 80.0–100.0)
Platelets: 262 10*3/uL (ref 150–400)
RBC: 3.9 MIL/uL (ref 3.87–5.11)
RDW: 12.4 % (ref 11.5–15.5)
WBC: 5.7 10*3/uL (ref 4.0–10.5)
nRBC: 0 % (ref 0.0–0.2)

## 2021-06-24 LAB — POCT PREGNANCY, URINE: Preg Test, Ur: NEGATIVE

## 2021-06-24 SURGERY — CONE BIOPSY, CERVIX
Anesthesia: General | Site: Cervix

## 2021-06-24 MED ORDER — PHENYLEPHRINE 40 MCG/ML (10ML) SYRINGE FOR IV PUSH (FOR BLOOD PRESSURE SUPPORT)
PREFILLED_SYRINGE | INTRAVENOUS | Status: DC | PRN
  Administered 2021-06-24 (×3): 40 ug via INTRAVENOUS

## 2021-06-24 MED ORDER — HYDROCODONE-ACETAMINOPHEN 5-325 MG PO TABS: 1.0000 | ORAL_TABLET | Freq: Four times a day (QID) | ORAL | 0 refills | Status: AC | PRN

## 2021-06-24 MED ORDER — LIDOCAINE-EPINEPHRINE 1 %-1:100000 IJ SOLN
INTRAMUSCULAR | Status: DC | PRN
  Administered 2021-06-24: 5 mL
  Administered 2021-06-24: 10 mL

## 2021-06-24 MED ORDER — LIDOCAINE 2% (20 MG/ML) 5 ML SYRINGE
INTRAMUSCULAR | Status: DC | PRN
  Administered 2021-06-24: 60 mg via INTRAVENOUS

## 2021-06-24 MED ORDER — SCOPOLAMINE 1 MG/3DAYS TD PT72
MEDICATED_PATCH | TRANSDERMAL | Status: AC
  Filled 2021-06-24: qty 1

## 2021-06-24 MED ORDER — PROPOFOL 10 MG/ML IV BOLUS
INTRAVENOUS | Status: AC
  Filled 2021-06-24: qty 20

## 2021-06-24 MED ORDER — KETOROLAC TROMETHAMINE 30 MG/ML IJ SOLN
INTRAMUSCULAR | Status: DC | PRN
  Administered 2021-06-24: 30 mg via INTRAVENOUS

## 2021-06-24 MED ORDER — KETOROLAC TROMETHAMINE 30 MG/ML IJ SOLN
INTRAMUSCULAR | Status: AC
  Filled 2021-06-24: qty 1

## 2021-06-24 MED ORDER — GLYCOPYRROLATE PF 0.2 MG/ML IJ SOSY
PREFILLED_SYRINGE | INTRAMUSCULAR | Status: DC | PRN
  Administered 2021-06-24: .2 mg via INTRAVENOUS

## 2021-06-24 MED ORDER — FENTANYL CITRATE (PF) 100 MCG/2ML IJ SOLN: 25.0000 ug | INTRAMUSCULAR | Status: DC | PRN

## 2021-06-24 MED ORDER — MIDAZOLAM HCL 5 MG/5ML IJ SOLN
INTRAMUSCULAR | Status: DC | PRN
  Administered 2021-06-24: 2 mg via INTRAVENOUS

## 2021-06-24 MED ORDER — ONDANSETRON HCL 4 MG/2ML IJ SOLN
INTRAMUSCULAR | Status: AC
  Filled 2021-06-24: qty 2

## 2021-06-24 MED ORDER — DEXAMETHASONE SODIUM PHOSPHATE 10 MG/ML IJ SOLN
INTRAMUSCULAR | Status: AC
  Filled 2021-06-24: qty 1

## 2021-06-24 MED ORDER — ONDANSETRON HCL 4 MG/2ML IJ SOLN
INTRAMUSCULAR | Status: DC | PRN
  Administered 2021-06-24: 4 mg via INTRAVENOUS

## 2021-06-24 MED ORDER — IBUPROFEN 800 MG PO TABS: 800.0000 mg | ORAL_TABLET | Freq: Three times a day (TID) | ORAL | 0 refills | Status: AC | PRN

## 2021-06-24 MED ORDER — LIDOCAINE HCL (PF) 2 % IJ SOLN
INTRAMUSCULAR | Status: AC
  Filled 2021-06-24: qty 5

## 2021-06-24 MED ORDER — GLYCOPYRROLATE PF 0.2 MG/ML IJ SOSY
PREFILLED_SYRINGE | INTRAMUSCULAR | Status: AC
  Filled 2021-06-24: qty 1

## 2021-06-24 MED ORDER — SCOPOLAMINE 1 MG/3DAYS TD PT72
1.0000 | MEDICATED_PATCH | TRANSDERMAL | Status: DC
  Administered 2021-06-24: 1.5 mg via TRANSDERMAL

## 2021-06-24 MED ORDER — POVIDONE-IODINE 10 % EX SWAB: 2.0000 "application " | Freq: Once | CUTANEOUS | Status: DC

## 2021-06-24 MED ORDER — ACETAMINOPHEN 500 MG PO TABS
1000.0000 mg | ORAL_TABLET | Freq: Once | ORAL | Status: AC
  Administered 2021-06-24: 1000 mg via ORAL

## 2021-06-24 MED ORDER — MIDAZOLAM HCL 2 MG/2ML IJ SOLN
INTRAMUSCULAR | Status: AC
  Filled 2021-06-24: qty 2

## 2021-06-24 MED ORDER — ACETAMINOPHEN 500 MG PO TABS
ORAL_TABLET | ORAL | Status: AC
  Filled 2021-06-24: qty 2

## 2021-06-24 MED ORDER — FENTANYL CITRATE (PF) 250 MCG/5ML IJ SOLN
INTRAMUSCULAR | Status: AC
  Filled 2021-06-24: qty 5

## 2021-06-24 MED ORDER — LACTATED RINGERS IV SOLN: INTRAVENOUS | Status: DC

## 2021-06-24 MED ORDER — PHENYLEPHRINE 40 MCG/ML (10ML) SYRINGE FOR IV PUSH (FOR BLOOD PRESSURE SUPPORT)
PREFILLED_SYRINGE | INTRAVENOUS | Status: AC
  Filled 2021-06-24: qty 20

## 2021-06-24 MED ORDER — FENTANYL CITRATE (PF) 100 MCG/2ML IJ SOLN
INTRAMUSCULAR | Status: DC | PRN
  Administered 2021-06-24: 50 ug via INTRAVENOUS

## 2021-06-24 MED ORDER — EPHEDRINE SULFATE-NACL 50-0.9 MG/10ML-% IV SOSY
PREFILLED_SYRINGE | INTRAVENOUS | Status: DC | PRN
  Administered 2021-06-24: 5 mg via INTRAVENOUS

## 2021-06-24 MED ORDER — PROPOFOL 10 MG/ML IV BOLUS
INTRAVENOUS | Status: DC | PRN
  Administered 2021-06-24: 150 mg via INTRAVENOUS

## 2021-06-24 MED ORDER — EPHEDRINE 5 MG/ML INJ
INTRAVENOUS | Status: AC
  Filled 2021-06-24: qty 10

## 2021-06-24 MED ORDER — DEXAMETHASONE SODIUM PHOSPHATE 10 MG/ML IJ SOLN
INTRAMUSCULAR | Status: DC | PRN
  Administered 2021-06-24: 10 mg via INTRAVENOUS

## 2021-06-24 MED ORDER — FERRIC SUBSULFATE SOLN
Status: DC | PRN
  Administered 2021-06-24: 1 via TOPICAL

## 2021-06-24 MED ORDER — IODINE STRONG (LUGOLS) 5 % PO SOLN
ORAL | Status: DC | PRN
  Administered 2021-06-24: 0.1 mL

## 2021-06-24 SURGICAL SUPPLY — 18 items
APL SWBSTK 6 STRL LF DISP (MISCELLANEOUS) ×1
APPLICATOR COTTON TIP 6 STRL (MISCELLANEOUS) ×1 IMPLANT
APPLICATOR COTTON TIP 6IN STRL (MISCELLANEOUS) ×2
BLADE SURG 11 STRL SS (BLADE) ×2 IMPLANT
ELECT BALL LEEP 5MM RED (ELECTRODE) ×2 IMPLANT
ELECT REM PT RETURN 9FT ADLT (ELECTROSURGICAL) ×2
ELECTRODE REM PT RTRN 9FT ADLT (ELECTROSURGICAL) ×1 IMPLANT
GAUZE 4X4 16PLY ~~LOC~~+RFID DBL (SPONGE) ×2 IMPLANT
GLOVE SURG ENC MOIS LTX SZ7 (GLOVE) ×2 IMPLANT
GLOVE SURG UNDER POLY LF SZ7 (GLOVE) ×6 IMPLANT
GOWN STRL REUS W/TWL LRG LVL3 (GOWN DISPOSABLE) ×4 IMPLANT
KIT TURNOVER CYSTO (KITS) ×2 IMPLANT
NS IRRIG 1000ML POUR BTL (IV SOLUTION) ×2 IMPLANT
PACK VAGINAL MINOR WOMEN LF (CUSTOM PROCEDURE TRAY) ×2 IMPLANT
PAD OB MATERNITY 4.3X12.25 (PERSONAL CARE ITEMS) ×2 IMPLANT
PENCIL SMOKE EVACUATOR (MISCELLANEOUS) ×2 IMPLANT
SUT VIC AB 2-0 CT1 18 (SUTURE) ×2 IMPLANT
TOWEL OR 17X26 10 PK STRL BLUE (TOWEL DISPOSABLE) ×2 IMPLANT

## 2021-06-24 NOTE — Anesthesia Preprocedure Evaluation (Addendum)
Anesthesia Evaluation  Patient identified by MRN, date of birth, ID band Patient awake    Reviewed: Allergy & Precautions, NPO status , Patient's Chart, lab work & pertinent test results  Airway Mallampati: II  TM Distance: >3 FB Neck ROM: Full    Dental no notable dental hx. (+) Teeth Intact, Dental Advisory Given   Pulmonary neg pulmonary ROS,    Pulmonary exam normal breath sounds clear to auscultation       Cardiovascular negative cardio ROS Normal cardiovascular exam Rhythm:Regular Rate:Normal     Neuro/Psych negative neurological ROS  negative psych ROS   GI/Hepatic negative GI ROS, Neg liver ROS,   Endo/Other  negative endocrine ROS  Renal/GU negative Renal ROS  negative genitourinary   Musculoskeletal negative musculoskeletal ROS (+)   Abdominal   Peds  Hematology negative hematology ROS (+)   Anesthesia Other Findings   Reproductive/Obstetrics                            Anesthesia Physical Anesthesia Plan  ASA: 2  Anesthesia Plan: General   Post-op Pain Management:    Induction: Intravenous  PONV Risk Score and Plan: 3 and Ondansetron, Dexamethasone and Midazolam  Airway Management Planned: LMA  Additional Equipment:   Intra-op Plan:   Post-operative Plan: Extubation in OR  Informed Consent: I have reviewed the patients History and Physical, chart, labs and discussed the procedure including the risks, benefits and alternatives for the proposed anesthesia with the patient or authorized representative who has indicated his/her understanding and acceptance.     Dental advisory given  Plan Discussed with: CRNA  Anesthesia Plan Comments:         Anesthesia Quick Evaluation

## 2021-06-24 NOTE — Op Note (Signed)
Pre-Operative Diagnosis: 1) cervical intraepithelial neoplasia 3 Postoperative Diagnosis: 1) cervical intraepithelial neoplasia 3 Procedure: Cervical cone biopsy with endocervical curettage Surgeon: Dr. Waynard Reeds Assistant: None Operative Findings: Multiparous appearing cervix with intrauterine strings visualized protruding through the external os.  Previously visualized lesion from colposcopy noted.  Poorly staining with Lugol's solution. Specimen: Cervical cone, endocervical curettage EBL: Minimal   Julie Gibson Is a 45 year old who presents for definitive surgical management for CIN3. Please see the patient's history and physical for complete details of the history. Management options were discussed with the patient. R/B/A reviewed. Following appropriate informed consent was taken to the operating room. The patient was appropriately identified during a time out procedure. General anesthesia was administered and the patient was placed in the dorsal lithotomy position. The patient was prepped and draped in the normal sterile fashion. A speculum was placed into the vagina, a single-tooth tenaculum was placed on the anterior lip of the cervix, and 10 cc of 1% lidocaine with epinephrine was administered in a paracervical fashion.  Hemostatic interrupted sutures were placed at 3:00 and 9:00.  An angled 11 blade scalpel was used to make a circumferential incision in the cervix starting at 6:00.  A cone shaped cervical specimen was excised and a single suture was placed at 12:00.  A endocervical curettage was then performed.  Hemostasis was achieved with electrocautery, an additional 5 cc of 1% lidocaine with epinephrine, and Monsel solution.  This completed the surgical case.  All sponge, needle, instrument counts were correct.  EBL minimal.  The patient was transported to the PACU in stable condition following the procedure

## 2021-06-24 NOTE — Anesthesia Postprocedure Evaluation (Signed)
Anesthesia Post Note  Patient: Julie Gibson  Procedure(s) Performed: CONIZATION CERVIX WITH BIOPSY (Cervix)     Patient location during evaluation: PACU Anesthesia Type: General Level of consciousness: awake and alert Pain management: pain level controlled Vital Signs Assessment: post-procedure vital signs reviewed and stable Respiratory status: spontaneous breathing, nonlabored ventilation, respiratory function stable and patient connected to nasal cannula oxygen Cardiovascular status: blood pressure returned to baseline and stable Postop Assessment: no apparent nausea or vomiting Anesthetic complications: no   No notable events documented.  Last Vitals:  Vitals:   06/24/21 1345 06/24/21 1405  BP: 124/76 117/74  Pulse: (!) 103 91  Resp: (!) 21 18  Temp: (!) 36.4 C (!) 36.3 C  SpO2: 100% 100%    Last Pain:  Vitals:   06/24/21 1405  TempSrc:   PainSc: 0-No pain                 Devante Capano L Markanthony Gedney

## 2021-06-24 NOTE — Discharge Instructions (Addendum)
  Post Anesthesia Home Care Instructions  Activity: Get plenty of rest for the remainder of the day. A responsible individual must stay with you for 24 hours following the procedure.  For the next 24 hours, DO NOT: -Drive a car -Advertising copywriter -Drink alcoholic beverages -Take any medication unless instructed by your physician -Make any legal decisions or sign important papers.  Meals: Start with liquid foods such as gelatin or soup. Progress to regular foods as tolerated. Avoid greasy, spicy, heavy foods. If nausea and/or vomiting occur, drink only clear liquids until the nausea and/or vomiting subsides. Call your physician if vomiting continues.  Special Instructions/Symptoms: Your throat may feel dry or sore from the anesthesia or the breathing tube placed in your throat during surgery. If this causes discomfort, gargle with warm salt water. The discomfort should disappear within 24 hours.  If you had a scopolamine patch placed behind your ear for the management of post- operative nausea and/or vomiting:  1. The medication in the patch is effective for 72 hours, after which it should be removed.  Wrap patch in a tissue and discard in the trash. Wash hands thoroughly with soap and water. 2. You may remove the patch earlier than 72 hours if you experience unpleasant side effects which may include dry mouth, dizziness or visual disturbances. 3. Avoid touching the patch. Wash your hands with soap and water after contact with the patch.    **No acetaminophen/Tylenol until after 4:30 pm today if needed. **No ibuprofen, Advil, Aleve, Motrin, ketorolac, meloxicam, or naproxen until after 7:15 pm today if needed.

## 2021-06-24 NOTE — Anesthesia Procedure Notes (Signed)
Procedure Name: LMA Insertion Date/Time: 06/24/2021 12:44 PM Performed by: Bishop Limbo, CRNA Pre-anesthesia Checklist: Patient identified, Emergency Drugs available, Suction available and Patient being monitored Patient Re-evaluated:Patient Re-evaluated prior to induction Oxygen Delivery Method: Circle System Utilized Preoxygenation: Pre-oxygenation with 100% oxygen Induction Type: IV induction Ventilation: Mask ventilation without difficulty LMA: LMA inserted LMA Size: 4.0 Number of attempts: 1 Placement Confirmation: positive ETCO2 Tube secured with: Tape Dental Injury: Teeth and Oropharynx as per pre-operative assessment

## 2021-06-24 NOTE — Transfer of Care (Signed)
Immediate Anesthesia Transfer of Care Note  Patient: Julie Gibson  Procedure(s) Performed: CONIZATION CERVIX WITH BIOPSY (Cervix)  Patient Location: PACU  Anesthesia Type:General  Level of Consciousness: awake, alert , oriented and patient cooperative  Airway & Oxygen Therapy: Patient Spontanous Breathing  Post-op Assessment: Report given to RN and Post -op Vital signs reviewed and stable  Post vital signs: Reviewed and stable  Last Vitals:  Vitals Value Taken Time  BP 109/72 06/24/21 1333  Temp 36.4 C 06/24/21 1333  Pulse 105 06/24/21 1337  Resp 12 06/24/21 1336  SpO2 98 % 06/24/21 1337  Vitals shown include unvalidated device data.  Last Pain:  Vitals:   06/24/21 1333  TempSrc:   PainSc: 0-No pain      Patients Stated Pain Goal: 5 (06/24/21 1041)  Complications: No notable events documented.

## 2021-06-24 NOTE — H&P (Signed)
Julie Gibson is an 45 y.o. female presents for Cone biopsy  45 yo female presents for cone biopsy for CIN3 on colposcopic biopsy. Patient's recent pap smear showed HGSIL + HR HPV, +16. Colpo bx showed CIN 2/3, ECC benign. Management options discussed and Cervical cone biopsy recommended. R/B/A reviewed and patient wishes to proceed.  No LMP recorded. (Menstrual status: IUD).    Past Medical History:  Diagnosis Date   High grade squamous intraepithelial lesion (HGSIL), grade 3 CIN, on biopsy of cervix 06/22/2021   MVP (mitral valve prolapse) 2005   diagnosed after bout of tachycardia. Has not had issue in over 10 years   Wears contact lenses 06/22/2021    Past Surgical History:  Procedure Laterality Date   WISDOM TOOTH EXTRACTION     15 yrs ago    Family History  Problem Relation Age of Onset   Hypertension Mother    Hypertension Father     Social History:  reports that she has never smoked. She has never used smokeless tobacco. She reports current alcohol use of about 1.0 standard drink of alcohol per week. She reports that she does not use drugs.  Allergies:  Allergies  Allergen Reactions   Percocet [Oxycodone-Acetaminophen] Nausea And Vomiting    Medications Prior to Admission  Medication Sig Dispense Refill Last Dose   levonorgestrel (MIRENA) 20 MCG/24HR IUD 1 each by Intrauterine route once. Inserted 5 yrs ago per pt on 06-22-2021   06/24/2021    Review of Systems  Blood pressure 107/62, pulse 72, temperature 98.6 F (37 C), temperature source Oral, resp. rate 15, height 5' 3.75" (1.619 m), weight 61.6 kg, SpO2 100 %. Physical Exam  AOx3, NAD Normal work of breathing  Specimen A-Cervical Biopsy, cervix at 5:00: SCANT MINUTE FRAGMENTS OF HIGH-GRADE SQUAMOUS INTRAEPITHELIAL LESION (CIN 2-3). Specimen B-Cervical Biopsy, cervix at 6:00: HIGH-GRADE SQUAMOUS INTRAEPITHELIAL LESION (CIN 2-3). TRANSFORMATION ZONE IDENTIFIED. Specimen C-Cervical Biopsy, cervix at 8:00:  HIGH-GRADE SQUAMOUS INTRAEPITHELIAL LESION (CIN 3). Specimen D-Cervical Biopsy, cervix at 12:00: HIGH-GRADE SQUAMOUS INTRAEPITHELIAL LESION (CIN 2-3). Specimen E-Endocervical Curettings: HIGH-GRADE SQUAMOUS INTRAEPITHELIAL LESION (CIN 3). SCANT BENIGN ENDOCERVICAL TISSUE.   Results for orders placed or performed during the hospital encounter of 06/24/21 (from the past 24 hour(s))  Pregnancy, urine POC     Status: None   Collection Time: 06/24/21 10:25 AM  Result Value Ref Range   Preg Test, Ur NEGATIVE NEGATIVE  CBC     Status: Abnormal   Collection Time: 06/24/21 11:00 AM  Result Value Ref Range   WBC 5.7 4.0 - 10.5 K/uL   RBC 3.90 3.87 - 5.11 MIL/uL   Hemoglobin 11.9 (L) 12.0 - 15.0 g/dL   HCT 50.5 39.7 - 67.3 %   MCV 92.8 80.0 - 100.0 fL   MCH 30.5 26.0 - 34.0 pg   MCHC 32.9 30.0 - 36.0 g/dL   RDW 41.9 37.9 - 02.4 %   Platelets 262 150 - 400 K/uL   nRBC 0.0 0.0 - 0.2 %    No results found.  Assessment/Plan: 45 yo with CIN 3 1) Proceed with cervical cone biopsy 2) SCDs for DVT prophylaxis   Waynard Reeds 06/24/2021, 12:03 PM

## 2021-06-25 ENCOUNTER — Encounter (HOSPITAL_BASED_OUTPATIENT_CLINIC_OR_DEPARTMENT_OTHER): Payer: Self-pay | Admitting: Obstetrics and Gynecology

## 2021-06-25 LAB — SURGICAL PATHOLOGY

## 2021-08-06 ENCOUNTER — Other Ambulatory Visit: Payer: Self-pay | Admitting: Obstetrics and Gynecology

## 2021-08-06 DIAGNOSIS — R928 Other abnormal and inconclusive findings on diagnostic imaging of breast: Secondary | ICD-10-CM

## 2021-11-05 ENCOUNTER — Emergency Department (HOSPITAL_BASED_OUTPATIENT_CLINIC_OR_DEPARTMENT_OTHER): Payer: 59

## 2021-11-05 ENCOUNTER — Emergency Department (HOSPITAL_BASED_OUTPATIENT_CLINIC_OR_DEPARTMENT_OTHER)
Admission: EM | Admit: 2021-11-05 | Discharge: 2021-11-05 | Disposition: A | Discharge status: Home / Self Care | Payer: 59 | Attending: Emergency Medicine | Admitting: Emergency Medicine

## 2021-11-05 ENCOUNTER — Encounter (HOSPITAL_BASED_OUTPATIENT_CLINIC_OR_DEPARTMENT_OTHER): Payer: Self-pay

## 2021-11-05 ENCOUNTER — Other Ambulatory Visit: Payer: Self-pay

## 2021-11-05 DIAGNOSIS — M549 Dorsalgia, unspecified: Secondary | ICD-10-CM

## 2021-11-05 DIAGNOSIS — R82998 Other abnormal findings in urine: Secondary | ICD-10-CM

## 2021-11-05 DIAGNOSIS — M546 Pain in thoracic spine: Secondary | ICD-10-CM | POA: Diagnosis present

## 2021-11-05 DIAGNOSIS — R1010 Upper abdominal pain, unspecified: Secondary | ICD-10-CM | POA: Diagnosis not present

## 2021-11-05 DIAGNOSIS — R52 Pain, unspecified: Secondary | ICD-10-CM

## 2021-11-05 DIAGNOSIS — E860 Dehydration: Secondary | ICD-10-CM

## 2021-11-05 LAB — COMPREHENSIVE METABOLIC PANEL
ALT: 9 U/L (ref 0–44)
AST: 12 U/L — ABNORMAL LOW (ref 15–41)
Albumin: 4.5 g/dL (ref 3.5–5.0)
Alkaline Phosphatase: 43 U/L (ref 38–126)
Anion gap: 8 (ref 5–15)
BUN: 14 mg/dL (ref 6–20)
CO2: 28 mmol/L (ref 22–32)
Calcium: 9.6 mg/dL (ref 8.9–10.3)
Chloride: 106 mmol/L (ref 98–111)
Creatinine, Ser: 0.64 mg/dL (ref 0.44–1.00)
GFR, Estimated: >60 mL/min (ref >60)
Glucose, Bld: 95 mg/dL (ref 70–99)
Potassium: 3.9 mmol/L (ref 3.5–5.1)
Sodium: 142 mmol/L (ref 135–145)
Total Bilirubin: 0.5 mg/dL (ref 0.3–1.2)
Total Protein: 7 g/dL (ref 6.5–8.1)

## 2021-11-05 LAB — CBC
HCT: 36.8 % (ref 36.0–46.0)
Hemoglobin: 12.2 g/dL (ref 12.0–15.0)
MCH: 29.6 pg (ref 26.0–34.0)
MCHC: 33.2 g/dL (ref 30.0–36.0)
MCV: 89.3 fL (ref 80.0–100.0)
Platelets: 261 10*3/uL (ref 150–400)
RBC: 4.12 MIL/uL (ref 3.87–5.11)
RDW: 12.1 % (ref 11.5–15.5)
WBC: 5.7 10*3/uL (ref 4.0–10.5)
nRBC: 0 % (ref 0.0–0.2)

## 2021-11-05 LAB — URINALYSIS, ROUTINE W REFLEX MICROSCOPIC
Bilirubin Urine: NEGATIVE
Glucose, UA: NEGATIVE mg/dL
Hgb urine dipstick: NEGATIVE
Ketones, ur: 40 mg/dL — AB
Nitrite: NEGATIVE
Protein, ur: NEGATIVE mg/dL
Specific Gravity, Urine: >1.046 — ABNORMAL HIGH (ref 1.005–1.030)
pH: 6.5 (ref 5.0–8.0)

## 2021-11-05 LAB — PREGNANCY, URINE: Preg Test, Ur: NEGATIVE

## 2021-11-05 LAB — LIPASE, BLOOD: Lipase: 24 U/L (ref 11–51)

## 2021-11-05 MED ORDER — KETOROLAC TROMETHAMINE 15 MG/ML IJ SOLN
15.0000 mg | Freq: Once | INTRAMUSCULAR | Status: AC
  Administered 2021-11-05: 19:00:00 15 mg via INTRAVENOUS
  Filled 2021-11-05: qty 1

## 2021-11-05 MED ORDER — CEPHALEXIN 500 MG PO CAPS: 1000.0000 mg | ORAL_CAPSULE | Freq: Two times a day (BID) | ORAL | 0 refills | Status: AC

## 2021-11-05 MED ORDER — ACETAMINOPHEN 500 MG PO TABS
1000.0000 mg | ORAL_TABLET | Freq: Once | ORAL | Status: AC
  Administered 2021-11-05: 19:00:00 1000 mg via ORAL
  Filled 2021-11-05: qty 2

## 2021-11-05 MED ORDER — METHOCARBAMOL 500 MG PO TABS
750.0000 mg | ORAL_TABLET | Freq: Once | ORAL | Status: AC
  Administered 2021-11-05: 19:00:00 750 mg via ORAL
  Filled 2021-11-05: qty 2

## 2021-11-05 MED ORDER — IOHEXOL 300 MG/ML  SOLN
80.0000 mL | Freq: Once | INTRAMUSCULAR | Status: AC | PRN
  Administered 2021-11-05: 18:00:00 80 mL via INTRAVENOUS

## 2021-11-05 MED ORDER — ONDANSETRON HCL 4 MG/2ML IJ SOLN
4.0000 mg | Freq: Once | INTRAMUSCULAR | Status: AC
  Administered 2021-11-05: 18:00:00 4 mg via INTRAVENOUS
  Filled 2021-11-05: qty 2

## 2021-11-05 MED ORDER — LACTATED RINGERS IV BOLUS
1000.0000 mL | Freq: Once | INTRAVENOUS | Status: AC
  Administered 2021-11-05: 18:00:00 1000 mL via INTRAVENOUS

## 2021-11-05 MED ORDER — SODIUM CHLORIDE 0.9 % IV BOLUS: 1000.0000 mL | Freq: Once | INTRAVENOUS | Status: DC

## 2021-11-05 NOTE — ED Triage Notes (Signed)
Pt c/o bilateral lower thoracic back pain. Pt also states that she has some periumbilical tenderness on palpation. Pt denies injury. Pt denies nausea, but states she has a lack of appetite.

## 2021-11-05 NOTE — ED Notes (Signed)
Pt c/o pain. Requesting pain meds. Dr. Denton Lank notified

## 2021-11-05 NOTE — ED Notes (Signed)
Pt denies covid swab. Discharge instructions discussed with pt. Pt verbalized understanding. Pt stable and ambulatory.

## 2021-11-05 NOTE — ED Provider Notes (Signed)
[facility] EMERGENCY DEPT Provider Note   CSN: [id number] Arrival date & time: 11/05/21  1516     History Chief Complaint  Patient presents with   Back Pain    Julie Gibson is a 45 y.o. female.  Patient c/o mid to upper back pain and upper abd pain. Symptoms acute onset yesterday, moderate, dull, constant, persistent. No hx same pain. Denies back injury or strain, indicating does not feel is musculoskeletal. No hx pud, pancreatitis or gallstones. No fever or chills. Nausea, no vomiting. No mid to lower abd pain. No dysuria or gu c/o. No cough or uri symptoms. No chest pain. No pleuritic pain.   The history is provided by the patient.  Back Pain Associated symptoms: abdominal pain   Associated symptoms: no chest pain, no fever and no headaches       Past Medical History:  Diagnosis Date   High grade squamous intraepithelial lesion (HGSIL), grade 3 CIN, on biopsy of cervix 06/22/2021   MVP (mitral valve prolapse) 2005   diagnosed after bout of tachycardia. Has not had issue in over 10 years   Wears contact lenses 06/22/2021    Patient Active Problem List   Diagnosis Date Noted   Numbness and tingling in right hand 08/28/2020   Balance disorder 04/21/2012    Past Surgical History:  Procedure Laterality Date   CERVICAL CONIZATION W/BX N/A 06/24/2021   Procedure: CONIZATION CERVIX WITH BIOPSY;  Surgeon: Waynard Reeds, MD;  Location: [facility];  Service: Gynecology;  Laterality: N/A;   WISDOM TOOTH EXTRACTION     15 yrs ago     OB History   No obstetric history on file.     Family History  Problem Relation Age of Onset   Hypertension Mother    Hypertension Father     Social History   Tobacco Use   Smoking status: Never   Smokeless tobacco: Never  Vaping Use   Vaping Use: Never used  Substance Use Topics   Alcohol use: Not Currently    Alcohol/week: 1.0 standard drink    Types: 1 Glasses of wine per week    Comment: Every  month    Drug use: No    Home Medications Prior to Admission medications   Medication Sig Start Date End Date Taking? Authorizing Provider  HYDROcodone-acetaminophen (NORCO/VICODIN) 5-325 MG tablet Take 1 tablet by mouth every 6 (six) hours as needed for moderate pain. 06/24/21   Waynard Reeds, MD  ibuprofen (ADVIL) 800 MG tablet Take 1 tablet (800 mg total) by mouth every 8 (eight) hours as needed. 06/24/21   Waynard Reeds, MD  levonorgestrel (MIRENA) 20 MCG/24HR IUD 1 each by Intrauterine route once. Inserted 5 yrs ago per pt on 06-22-2021    [provider]    Allergies    Percocet [oxycodone-acetaminophen]  Review of Systems   Review of Systems  Constitutional:  Negative for chills and fever.  HENT:  Negative for sore throat.   Eyes:  Negative for redness.  Respiratory:  Negative for shortness of breath.   Cardiovascular:  Negative for chest pain.  Gastrointestinal:  Positive for abdominal pain.  Genitourinary:  Negative for flank pain.  Musculoskeletal:  Positive for back pain. Negative for neck pain.  Skin:  Negative for rash.  Neurological:  Negative for headaches.  Hematological:  Does not bruise/bleed easily.  Psychiatric/Behavioral:  Negative for confusion.    Physical Exam Updated Vital Signs BP (!) 149/93    Pulse (!) 113  Temp 98 F (36.7 C) (Oral)    Resp 18    Ht 1.626 m (5\' 4" )    Wt 59 kg    SpO2 100%    BMI 22.31 kg/m   Physical Exam Vitals and nursing note reviewed.  Constitutional:      Appearance: Normal appearance. She is well-developed.  HENT:     Head: Atraumatic.     Nose: Nose normal.     Mouth/Throat:     Mouth: Mucous membranes are moist.  Eyes:     General: No scleral icterus.    Conjunctiva/sclera: Conjunctivae normal.  Neck:     Trachea: No tracheal deviation.  Cardiovascular:     Rate and Rhythm: Normal rate and regular rhythm.     Pulses: Normal pulses.     Heart sounds: Normal heart sounds. No murmur heard.   No friction  rub. No gallop.  Pulmonary:     Effort: Pulmonary effort is normal. No respiratory distress.     Breath sounds: Normal breath sounds.  Abdominal:     General: Bowel sounds are normal. There is no distension.     Palpations: Abdomen is soft.     Tenderness: There is abdominal tenderness. There is no guarding.     Comments: Upper abd tenderness. No peritoneal signs.   Genitourinary:    Comments: No cva tenderness.  Musculoskeletal:        General: No swelling.     Cervical back: Normal range of motion and neck supple. No rigidity. No muscular tenderness.     Comments: T/L spine non tender, aligned, no step off. No sts.   Skin:    General: Skin is warm and dry.     Findings: No rash.  Neurological:     Mental Status: She is alert.     Comments: Alert, speech normal. Steady gait.   Psychiatric:        Mood and Affect: Mood normal.    ED Results / Procedures / Treatments   Labs (all labs ordered are listed, but only abnormal results are displayed) Results for orders placed or performed during the hospital encounter of 11/05/21  Lipase, blood  Result Value Ref Range   Lipase 24 11 - 51 U/L  Comprehensive metabolic panel  Result Value Ref Range   Sodium 142 135 - 145 mmol/L   Potassium 3.9 3.5 - 5.1 mmol/L   Chloride 106 98 - 111 mmol/L   CO2 28 22 - 32 mmol/L   Glucose, Bld 95 70 - 99 mg/dL   BUN 14 6 - 20 mg/dL   Creatinine, Ser 11/07/21 0.44 - 1.00 mg/dL   Calcium 9.6 8.9 - 9.47 mg/dL   Total Protein 7.0 6.5 - 8.1 g/dL   Albumin 4.5 3.5 - 5.0 g/dL   AST 12 (L) 15 - 41 U/L   ALT 9 0 - 44 U/L   Alkaline Phosphatase 43 38 - 126 U/L   Total Bilirubin 0.5 0.3 - 1.2 mg/dL   GFR, Estimated 65.4 >65 mL/min   Anion gap 8 5 - 15  CBC  Result Value Ref Range   WBC 5.7 4.0 - 10.5 K/uL   RBC 4.12 3.87 - 5.11 MIL/uL   Hemoglobin 12.2 12.0 - 15.0 g/dL   HCT >03 54.6 - 56.8 %   MCV 89.3 80.0 - 100.0 fL   MCH 29.6 26.0 - 34.0 pg   MCHC 33.2 30.0 - 36.0 g/dL   RDW 12.7 51.7 - 00.1 %  Platelets 261 150 - 400 K/uL   nRBC 0.0 0.0 - 0.2 %  Urinalysis, Routine w reflex microscopic Urine, Clean Catch  Result Value Ref Range   Color, Urine YELLOW YELLOW   APPearance CLEAR CLEAR   Specific Gravity, Urine >1.046 (H) 1.005 - 1.030   pH 6.5 5.0 - 8.0   Glucose, UA NEGATIVE NEGATIVE mg/dL   Hgb urine dipstick NEGATIVE NEGATIVE   Bilirubin Urine NEGATIVE NEGATIVE   Ketones, ur 40 (A) NEGATIVE mg/dL   Protein, ur NEGATIVE NEGATIVE mg/dL   Nitrite NEGATIVE NEGATIVE   Leukocytes,Ua SMALL (A) NEGATIVE   RBC / HPF 0-5 0 - 5 RBC/hpf   WBC, UA 6-10 0 - 5 WBC/hpf   Squamous Epithelial / LPF 0-5 0 - 5   Mucus PRESENT   Pregnancy, urine  Result Value Ref Range   Preg Test, Ur NEGATIVE NEGATIVE    EKG None  Radiology CT Abdomen Pelvis W Contrast  Result Date: 11/05/2021 CLINICAL DATA:  Upper abdominal pain EXAM: CT ABDOMEN AND PELVIS WITH CONTRAST TECHNIQUE: Multidetector CT imaging of the abdomen and pelvis was performed using the standard protocol following bolus administration of intravenous contrast. CONTRAST:  33mL OMNIPAQUE IOHEXOL 300 MG/ML  SOLN COMPARISON:  None. FINDINGS: Lower chest: Lung bases are clear. Hepatobiliary: Liver is within normal limits. Gallbladder is unremarkable. No intrahepatic or extrahepatic ductal dilatation. Pancreas: Within normal limits. Spleen: Within normal limits Adrenals/Urinary Tract: Adrenal glands are within normal limits. Kidneys are within normal limits.  No hydronephrosis. Bladder is within normal limits. Stomach/Bowel: Stomach is within normal limits. No evidence of bowel obstruction. Normal appendix (series 2/image 62). No colonic wall thickening or inflammatory changes. Vascular/Lymphatic: No evidence of abdominal aortic aneurysm. No suspicious abdominopelvic lymphadenopathy. Reproductive: Uterus is notable for an IUD in satisfactory position. Bilateral ovaries are within normal limits. Other: No abdominopelvic ascites. Musculoskeletal:  Visualized osseous structures are within normal limits. IMPRESSION: IUD in satisfactory position. Negative CT abdomen/pelvis. Electronically Signed   By: Charline Bills M.D.   On: 11/05/2021 18:29   US Abdomen Limited RUQ (LIVER/GB)  Result Date: 11/05/2021 CLINICAL DATA:  Upper abdominal pain EXAM: ULTRASOUND ABDOMEN LIMITED RIGHT UPPER QUADRANT COMPARISON:  None. FINDINGS: Gallbladder: No gallstones or wall thickening visualized. No sonographic Murphy sign noted by sonographer. Common bile duct: Diameter: 4 mm Liver: No focal lesion identified. Within normal limits in parenchymal echogenicity. Portal vein is patent on color Doppler imaging with normal direction of blood flow towards the liver. Other: None. IMPRESSION: Negative right upper quadrant ultrasound. Electronically Signed   By: Charline Bills M.D.   On: 11/05/2021 17:51    Procedures Procedures   Medications Ordered in ED Medications  lactated ringers bolus 1,000 mL (has no administration in time range)  ondansetron (ZOFRAN) injection 4 mg (has no administration in time range)    ED Course  I have reviewed the triage vital signs and the nursing notes.  Pertinent labs & imaging results that were available during my care of the patient were reviewed by me and considered in my medical decision making (see chart for details).    MDM Rules/Calculators/A&P                         Labs and imaging ordered.   Reviewed nursing notes and prior charts for additional history.   Labs reviewed/interpreted by me - wbc and lipase normal.  U/s reviewed/interpreted by me - no gallstones.   Persistent pain/tenderness.   CT  reviewed/interpreted by me - neg acute.   Pt requests non narcotic pain med. Toradol iv. Acetaminophen po. Robaxin po.   Additional labs reviewed/interpreted by me - UA w 6-10 wbc., nitrite neg. Pt denies dysuria or other gu c/o, and no cva tenderness on exam. Will cx.  UA was concentrated and w ketones - 2  liters iv given in ED.   Pt inquires of covid/flu test - will send.   Recheck pt, vitals normal, no distress. Pt currently appears stable for d/c.     Final Clinical Impression(s) / ED Diagnoses Final diagnoses:  Pain    Rx / DC Orders ED Discharge Orders     None        Cathren Laine, MD 11/05/21 1919

## 2021-11-05 NOTE — Discharge Instructions (Addendum)
It was our pleasure to provide your ER care today - we hope that you feel better.  Drink plenty of fluids/stay well hydrated. Take acetaminophen and/or ibuprofen as need.   Your covid/flu test is pending and should be resulted in 1-2 hours - you may check MyChart or call for results.   Your urine test had a few white blood cells, and was very concentrated - drink plenty of fluids. We did send a urine culture the results of which should be back in 1-2 days, check MyChart or call for results - if positive, fill and take antibiotic rx.   Follow up with primary care doctor in the coming week if symptoms fail to improve/resolve.  Return to ER if worse, new symptoms, fevers, new, worsening or severe pain, trouble breathing, or other concern.

## 2021-12-22 ENCOUNTER — Ambulatory Visit
Admission: RE | Admit: 2021-12-22 | Discharge: 2021-12-22 | Disposition: A | Discharge status: Home / Self Care | Payer: 59 | Source: Ambulatory Visit | Attending: Obstetrics and Gynecology | Admitting: Obstetrics and Gynecology

## 2021-12-22 ENCOUNTER — Other Ambulatory Visit: Payer: Self-pay | Admitting: Obstetrics and Gynecology

## 2021-12-22 ENCOUNTER — Other Ambulatory Visit: Payer: Self-pay

## 2021-12-22 DIAGNOSIS — R928 Other abnormal and inconclusive findings on diagnostic imaging of breast: Secondary | ICD-10-CM

## 2021-12-22 DIAGNOSIS — N6310 Unspecified lump in the right breast, unspecified quadrant: Secondary | ICD-10-CM

## 2022-01-05 ENCOUNTER — Ambulatory Visit
Admission: RE | Admit: 2022-01-05 | Discharge: 2022-01-05 | Disposition: A | Discharge status: Home / Self Care | Payer: 59 | Source: Ambulatory Visit | Attending: Obstetrics and Gynecology | Admitting: Obstetrics and Gynecology

## 2022-01-05 DIAGNOSIS — N6310 Unspecified lump in the right breast, unspecified quadrant: Secondary | ICD-10-CM

## 2022-09-09 IMAGING — MG MM BREAST LOCALIZATION CLIP
4 series · 4 of 12 positions shown · non-contrast
Comparison: Previous exam(s).

CLINICAL DATA: Evaluate biopsy marker

EXAM:
3D DIAGNOSTIC RIGHT MAMMOGRAM POST ULTRASOUND BIOPSY

[R CC synth-2D]
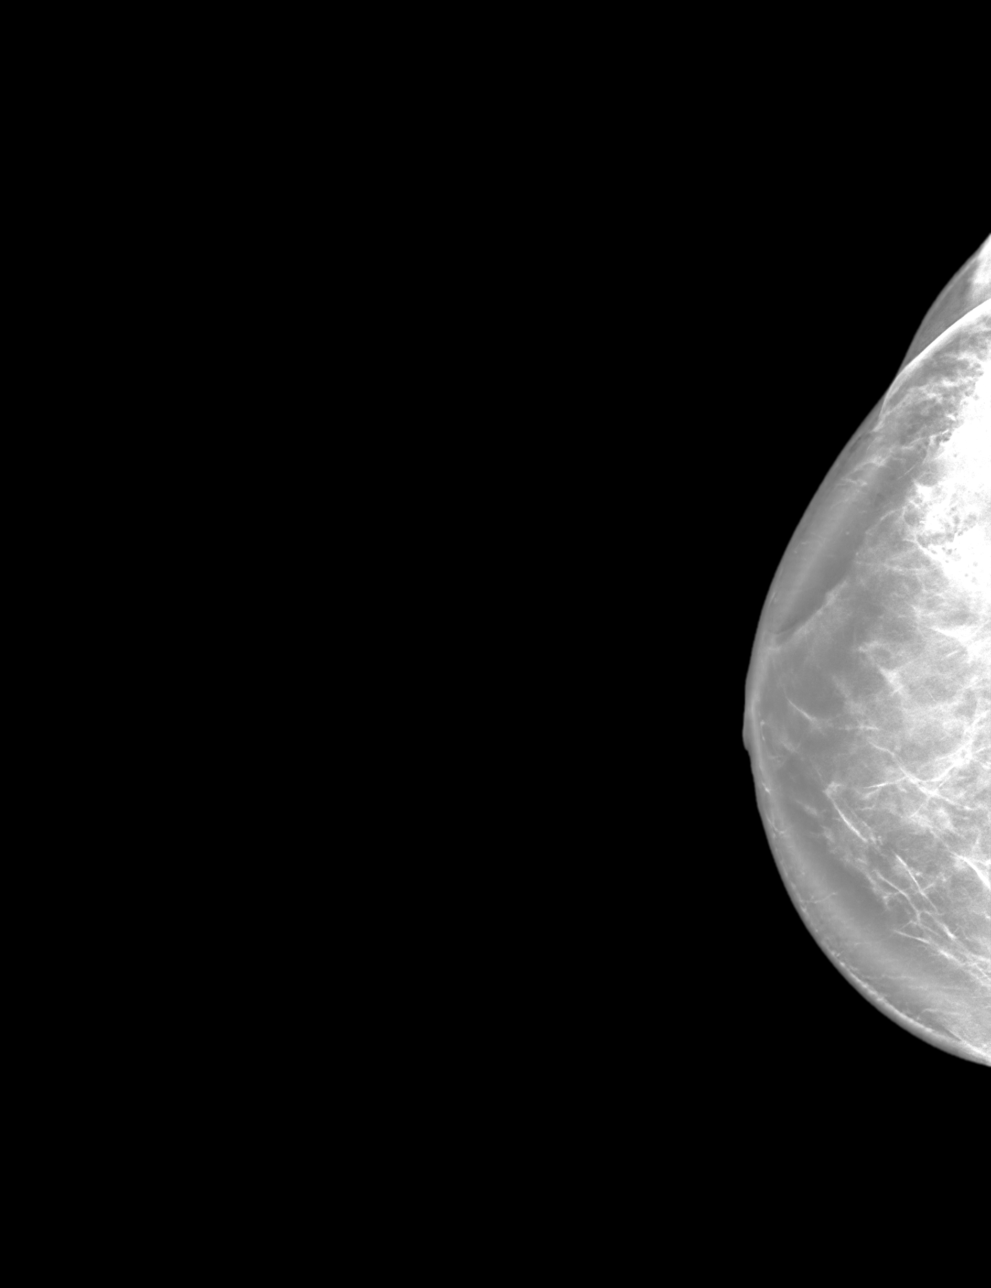

[R ML synth-2D]
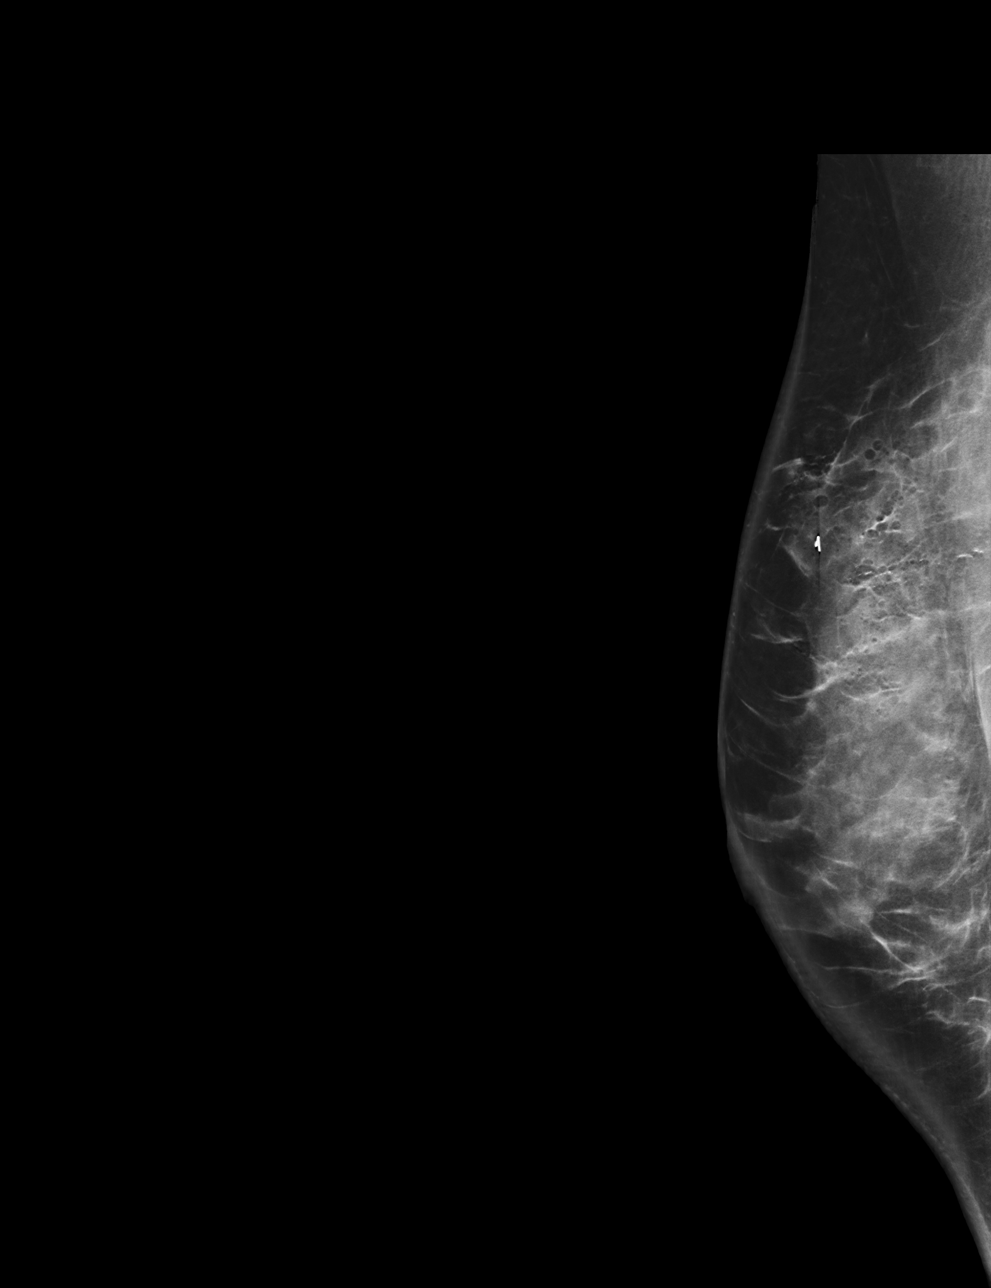

[R ML tomo · tomo slice 43/84.0]
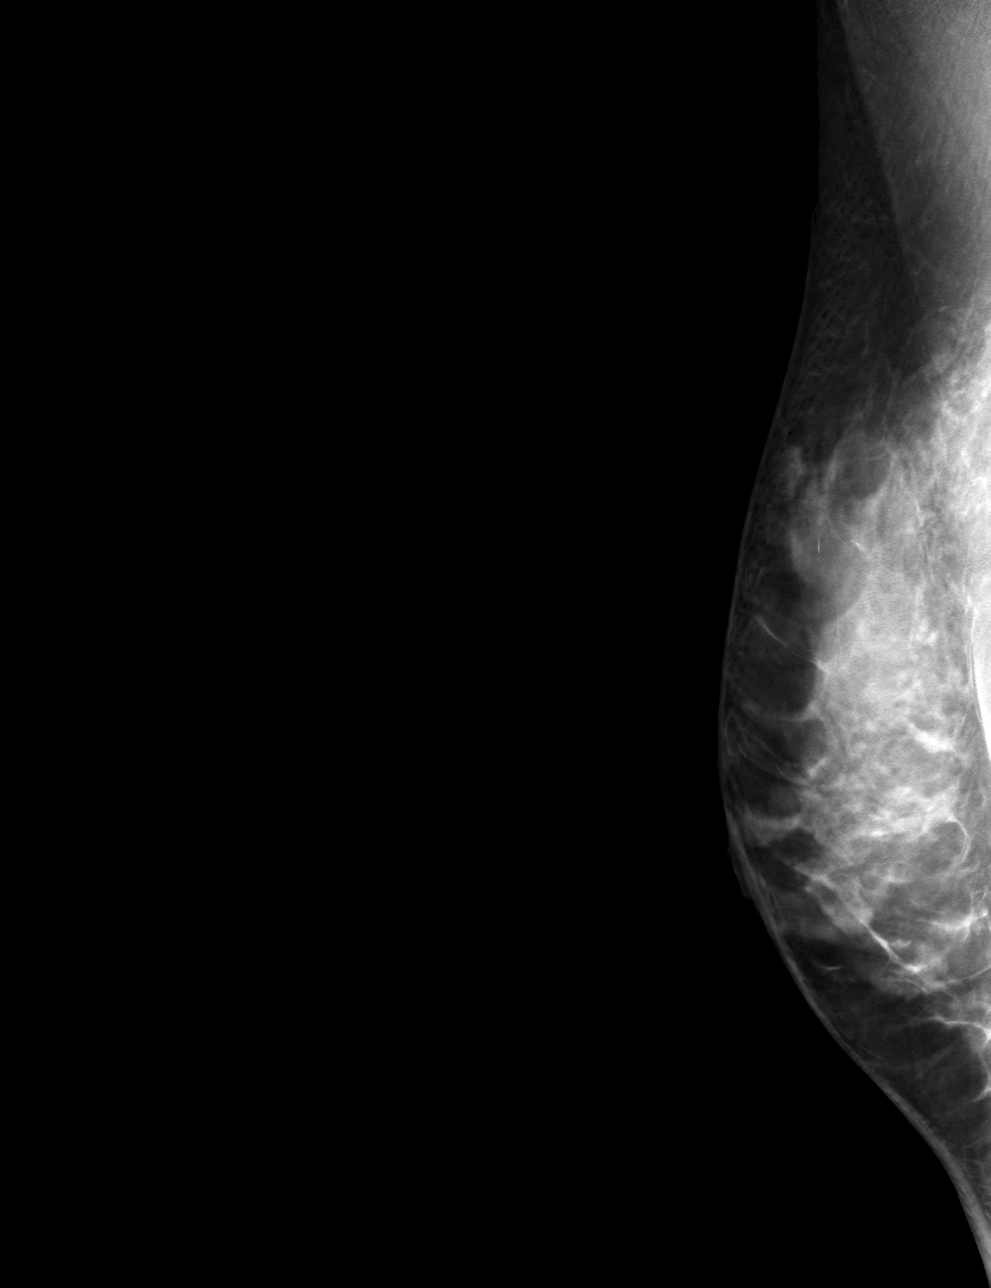

[R CC tomo · tomo slice 45/90.0]
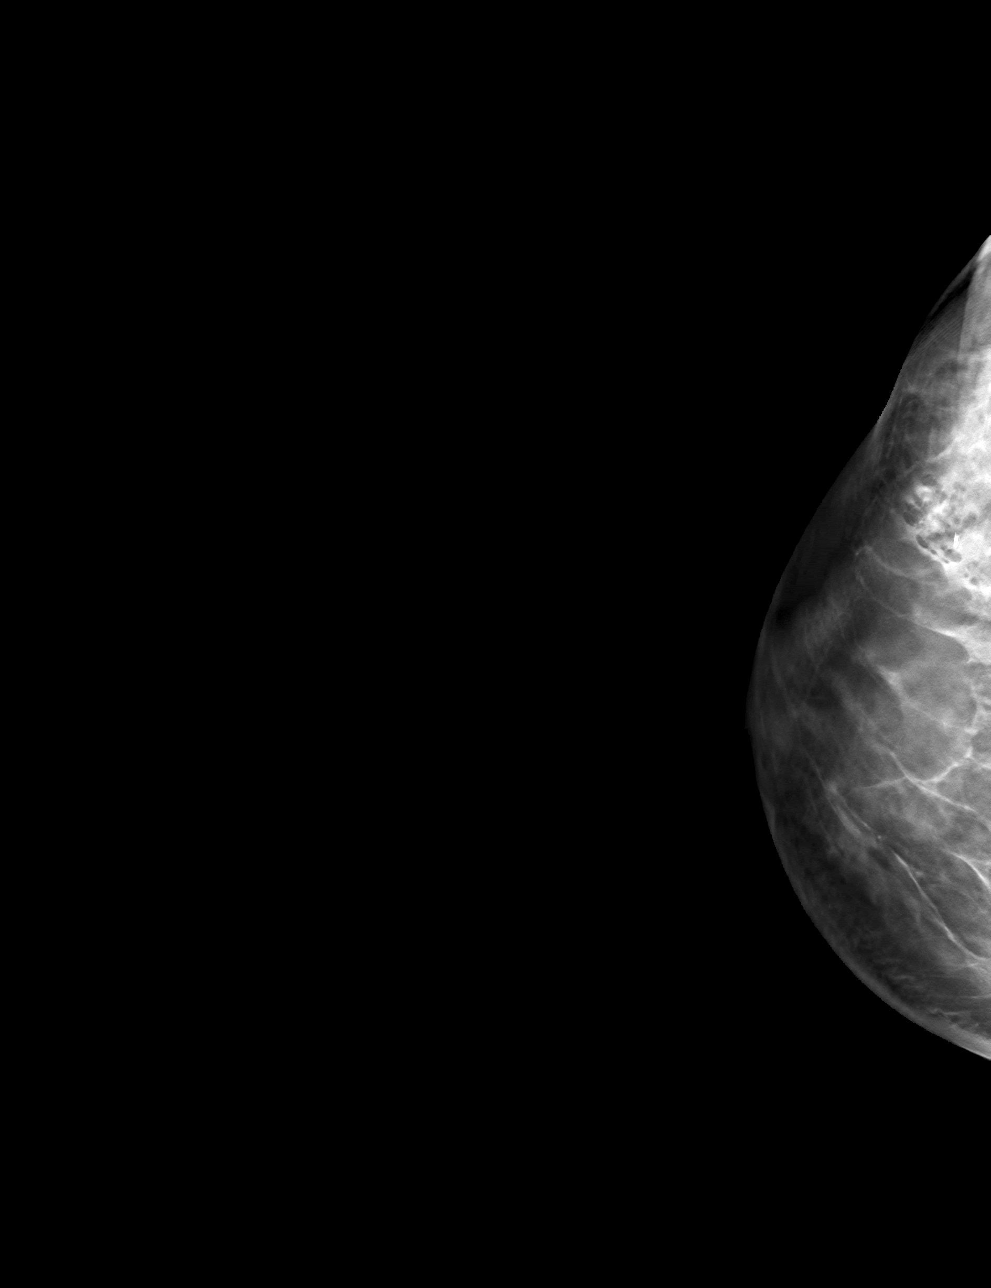

[4 of 12 positions shown; findings below may reference images not displayed]

FINDINGS: 3D Mammographic images were obtained following ultrasound guided
biopsy of a right breast mass. The biopsy marking clip is in
expected position at the site of biopsy.
IMPRESSION: Appropriate positioning of the ribbon shaped biopsy marking clip at
the site of biopsy in the biopsied right breast mass.

Final Assessment: Post Procedure Mammograms for Marker Placement
